# Patient Record
Sex: Male | Born: 1995 | Race: White | Hispanic: No | Marital: Single | Smoking: Never smoker
Health system: Southern US, Community
[De-identification: ages and names within clinical notes are randomized; demographics above are authoritative.]

## PROBLEM LIST (undated history)

## (undated) DIAGNOSIS — R42 Dizziness and giddiness: Secondary | ICD-10-CM

## (undated) HISTORY — DX: Dizziness and giddiness: R42

## (undated) HISTORY — PX: NO PAST SURGERIES: SHX2092

---
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
## 2017-03-05 DIAGNOSIS — K219 Gastro-esophageal reflux disease without esophagitis: Secondary | ICD-10-CM | POA: Diagnosis not present

## 2017-03-05 DIAGNOSIS — Z130 Encounter for screening for diseases of the blood and blood-forming organs and certain disorders involving the immune mechanism: Secondary | ICD-10-CM | POA: Diagnosis not present

## 2017-03-05 DIAGNOSIS — F419 Anxiety disorder, unspecified: Secondary | ICD-10-CM | POA: Diagnosis not present

## 2017-03-24 DIAGNOSIS — S67195A Crushing injury of left ring finger, initial encounter: Secondary | ICD-10-CM | POA: Diagnosis not present

## 2017-03-24 DIAGNOSIS — S62635B Displaced fracture of distal phalanx of left ring finger, initial encounter for open fracture: Secondary | ICD-10-CM | POA: Diagnosis not present

## 2017-03-24 DIAGNOSIS — S6992XA Unspecified injury of left wrist, hand and finger(s), initial encounter: Secondary | ICD-10-CM | POA: Diagnosis not present

## 2017-03-31 DIAGNOSIS — S62635D Displaced fracture of distal phalanx of left ring finger, subsequent encounter for fracture with routine healing: Secondary | ICD-10-CM | POA: Diagnosis not present

## 2017-04-15 ENCOUNTER — Other Ambulatory Visit: Payer: Self-pay | Admitting: Internal Medicine

## 2017-04-15 DIAGNOSIS — H538 Other visual disturbances: Secondary | ICD-10-CM | POA: Diagnosis not present

## 2017-04-15 DIAGNOSIS — Z1389 Encounter for screening for other disorder: Secondary | ICD-10-CM | POA: Diagnosis not present

## 2017-04-15 DIAGNOSIS — H539 Unspecified visual disturbance: Secondary | ICD-10-CM

## 2017-04-15 DIAGNOSIS — R519 Headache, unspecified: Secondary | ICD-10-CM

## 2017-04-15 DIAGNOSIS — R51 Headache: Principal | ICD-10-CM

## 2017-04-15 DIAGNOSIS — Z23 Encounter for immunization: Secondary | ICD-10-CM | POA: Diagnosis not present

## 2017-04-15 DIAGNOSIS — R42 Dizziness and giddiness: Secondary | ICD-10-CM

## 2017-04-16 ENCOUNTER — Other Ambulatory Visit (HOSPITAL_COMMUNITY): Payer: Self-pay | Admitting: Internal Medicine

## 2017-04-16 ENCOUNTER — Ambulatory Visit
Admission: RE | Admit: 2017-04-16 | Discharge: 2017-04-16 | Disposition: A | Discharge status: Home / Self Care | Payer: BLUE CROSS/BLUE SHIELD | Source: Ambulatory Visit | Attending: Internal Medicine | Admitting: Internal Medicine

## 2017-04-16 DIAGNOSIS — R42 Dizziness and giddiness: Secondary | ICD-10-CM

## 2017-04-16 DIAGNOSIS — R51 Headache: Secondary | ICD-10-CM | POA: Diagnosis not present

## 2017-04-16 DIAGNOSIS — R519 Headache, unspecified: Secondary | ICD-10-CM

## 2017-04-16 DIAGNOSIS — H539 Unspecified visual disturbance: Secondary | ICD-10-CM

## 2017-04-17 ENCOUNTER — Ambulatory Visit (HOSPITAL_COMMUNITY)
Admission: RE | Admit: 2017-04-17 | Discharge: 2017-04-17 | Disposition: A | Discharge status: Home / Self Care | Payer: BLUE CROSS/BLUE SHIELD | Source: Ambulatory Visit | Attending: Vascular Surgery | Admitting: Vascular Surgery

## 2017-04-17 DIAGNOSIS — R42 Dizziness and giddiness: Secondary | ICD-10-CM | POA: Insufficient documentation

## 2017-04-17 DIAGNOSIS — H539 Unspecified visual disturbance: Secondary | ICD-10-CM | POA: Insufficient documentation

## 2017-04-17 LAB — VAS US CAROTID
LCCADSYS: -114 cm/s
LCCAPDIAS: 37 cm/s
LEFT ECA DIAS: -22 cm/s
LICADSYS: -82 cm/s
LICAPSYS: -138 cm/s
Left CCA dist dias: -27 cm/s
Left CCA prox sys: 162 cm/s
Left ICA dist dias: -38 cm/s
Left ICA prox dias: -32 cm/s
RCCAPDIAS: 38 cm/s
RIGHT CCA MID DIAS: 33 cm/s
RIGHT ECA DIAS: -22 cm/s
Right CCA prox sys: 166 cm/s
Right cca dist sys: -93 cm/s

## 2017-04-21 DIAGNOSIS — S62635D Displaced fracture of distal phalanx of left ring finger, subsequent encounter for fracture with routine healing: Secondary | ICD-10-CM | POA: Diagnosis not present

## 2017-08-12 DIAGNOSIS — M5136 Other intervertebral disc degeneration, lumbar region: Secondary | ICD-10-CM | POA: Diagnosis not present

## 2017-08-12 DIAGNOSIS — M5431 Sciatica, right side: Secondary | ICD-10-CM | POA: Diagnosis not present

## 2017-08-12 DIAGNOSIS — M545 Low back pain: Secondary | ICD-10-CM | POA: Diagnosis not present

## 2017-08-12 DIAGNOSIS — M5432 Sciatica, left side: Secondary | ICD-10-CM | POA: Diagnosis not present

## 2017-08-20 DIAGNOSIS — M545 Low back pain: Secondary | ICD-10-CM | POA: Diagnosis not present

## 2017-08-27 DIAGNOSIS — M545 Low back pain: Secondary | ICD-10-CM | POA: Diagnosis not present

## 2017-08-27 DIAGNOSIS — M5386 Other specified dorsopathies, lumbar region: Secondary | ICD-10-CM | POA: Diagnosis not present

## 2017-08-27 DIAGNOSIS — M4316 Spondylolisthesis, lumbar region: Secondary | ICD-10-CM | POA: Diagnosis not present

## 2017-08-28 DIAGNOSIS — M5020 Other cervical disc displacement, unspecified cervical region: Secondary | ICD-10-CM | POA: Insufficient documentation

## 2017-08-28 DIAGNOSIS — M4310 Spondylolisthesis, site unspecified: Secondary | ICD-10-CM | POA: Insufficient documentation

## 2017-09-24 DIAGNOSIS — M545 Low back pain: Secondary | ICD-10-CM | POA: Diagnosis not present

## 2017-10-06 DIAGNOSIS — M5416 Radiculopathy, lumbar region: Secondary | ICD-10-CM | POA: Diagnosis not present

## 2017-10-09 DIAGNOSIS — M545 Low back pain: Secondary | ICD-10-CM | POA: Diagnosis not present

## 2017-10-13 DIAGNOSIS — M545 Low back pain: Secondary | ICD-10-CM | POA: Diagnosis not present

## 2017-10-16 DIAGNOSIS — M545 Low back pain: Secondary | ICD-10-CM | POA: Diagnosis not present

## 2017-10-20 DIAGNOSIS — M545 Low back pain: Secondary | ICD-10-CM | POA: Diagnosis not present

## 2017-10-23 DIAGNOSIS — M5136 Other intervertebral disc degeneration, lumbar region: Secondary | ICD-10-CM | POA: Diagnosis not present

## 2017-10-23 DIAGNOSIS — M545 Low back pain: Secondary | ICD-10-CM | POA: Diagnosis not present

## 2017-10-23 DIAGNOSIS — M4310 Spondylolisthesis, site unspecified: Secondary | ICD-10-CM | POA: Diagnosis not present

## 2017-10-23 DIAGNOSIS — M5386 Other specified dorsopathies, lumbar region: Secondary | ICD-10-CM | POA: Diagnosis not present

## 2017-10-27 DIAGNOSIS — M545 Low back pain: Secondary | ICD-10-CM | POA: Diagnosis not present

## 2017-10-30 DIAGNOSIS — M4310 Spondylolisthesis, site unspecified: Secondary | ICD-10-CM | POA: Diagnosis not present

## 2017-11-03 DIAGNOSIS — M545 Low back pain: Secondary | ICD-10-CM | POA: Diagnosis not present

## 2017-11-10 DIAGNOSIS — M545 Low back pain: Secondary | ICD-10-CM | POA: Diagnosis not present

## 2017-11-18 DIAGNOSIS — M4310 Spondylolisthesis, site unspecified: Secondary | ICD-10-CM | POA: Diagnosis not present

## 2018-01-14 DIAGNOSIS — M25561 Pain in right knee: Secondary | ICD-10-CM | POA: Diagnosis not present

## 2018-04-11 IMAGING — CT CT ABD-PELV W/ CM
2 of 4 series · 15 of 46 positions shown, 17 images · IV contrast (ISOVUE)
Comparison: None.

CLINICAL DATA: Right-sided abdominal pain.

EXAM:
CT ABDOMEN AND PELVIS WITH CONTRAST
TECHNIQUE: Multidetector CT imaging of the abdomen and pelvis was performed
using the standard protocol following bolus administration of
intravenous contrast.
CONTRAST:  100mL GKGZSC-IKK IOPAMIDOL (GKGZSC-IKK) INJECTION 61%

[Series 2: abd/pel with · axial · 0.69mm/px · z∈[-636,-236]mm · 12 of 91 slices shown, 14 images]
[im 6/91  soft-tissue]
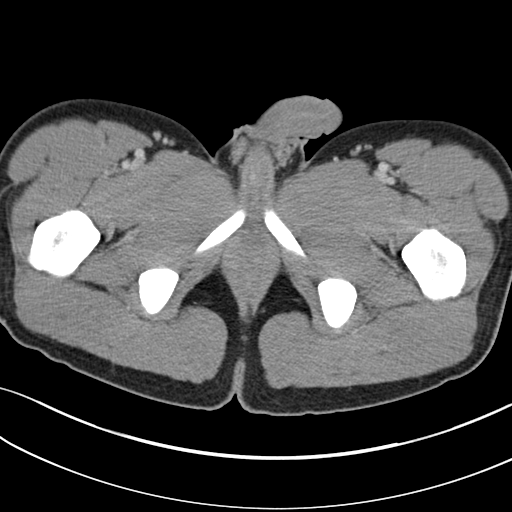
[im 6/91  bone]
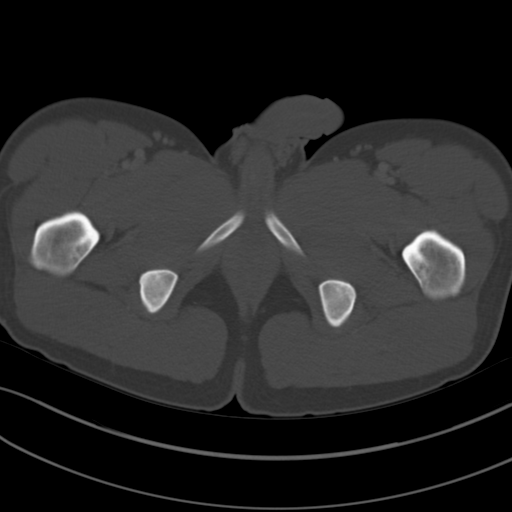
[im 16/91  soft-tissue]
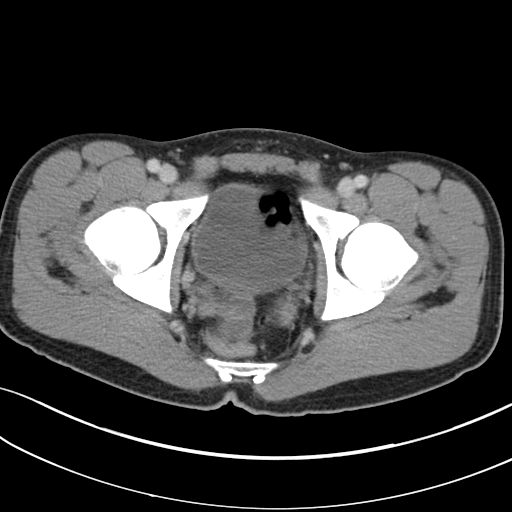
[im 21/91  soft-tissue]
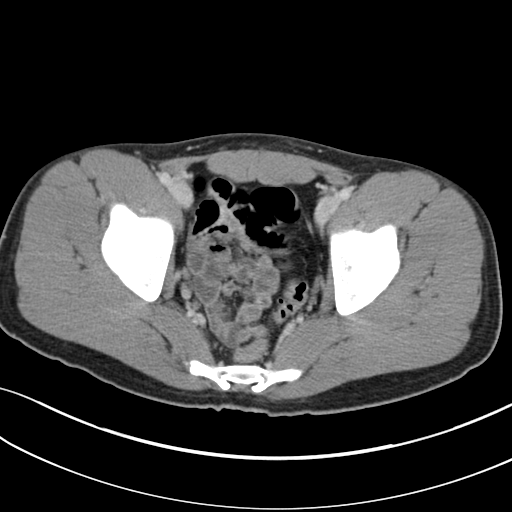
[im 26/91  soft-tissue]
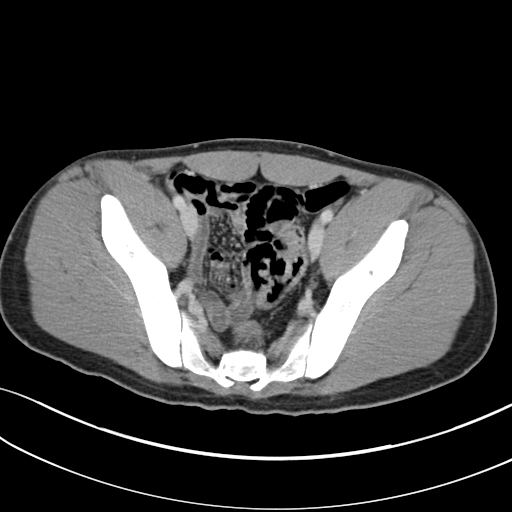
[im 36/91  soft-tissue]
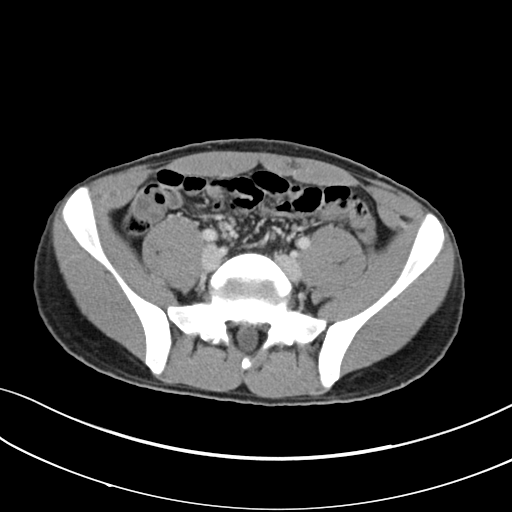
[im 41/91  soft-tissue]
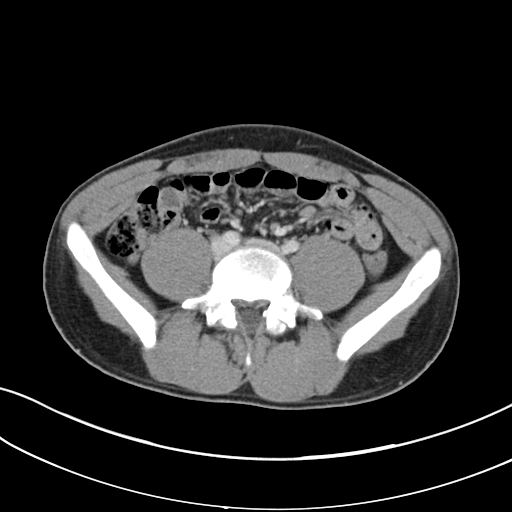
[im 51/91  soft-tissue]
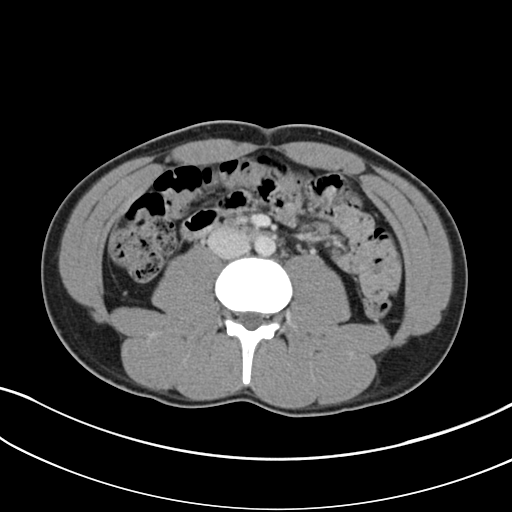
[im 56/91  soft-tissue]
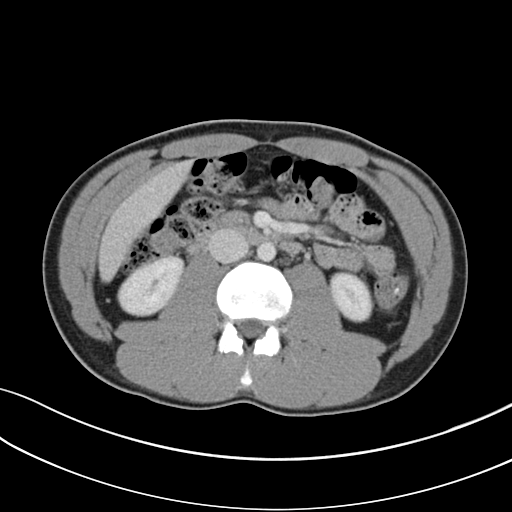
[im 66/91  soft-tissue]
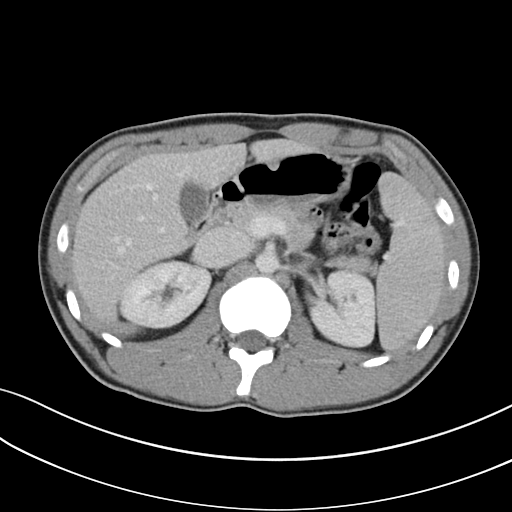
[im 66/91  bone]
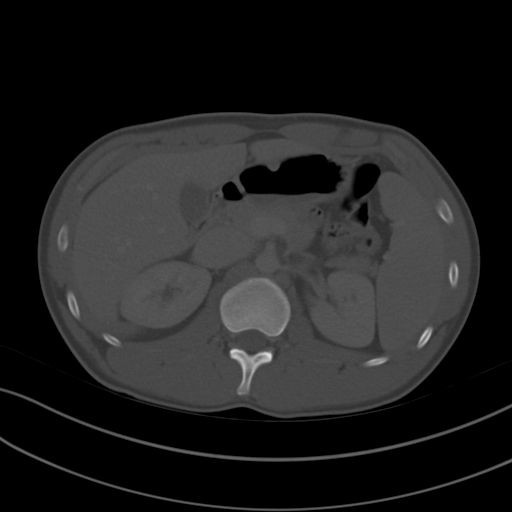
[im 71/91  soft-tissue]
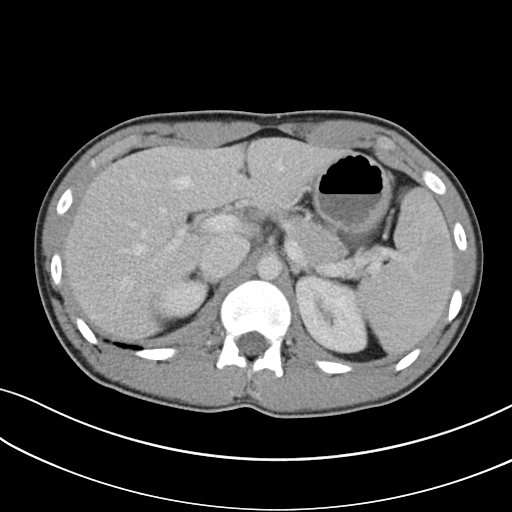
[im 76/91  soft-tissue]
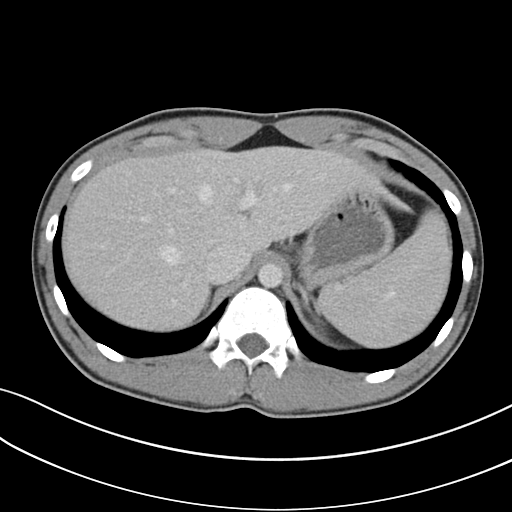
[im 86/91  soft-tissue]
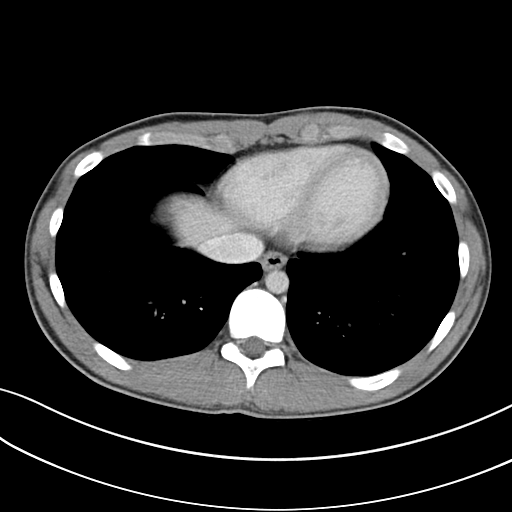

[Series 5: coronal a/|p · coronal · 0.61mm/px · 3 of 104 slices shown]
[im 35/104  soft-tissue]
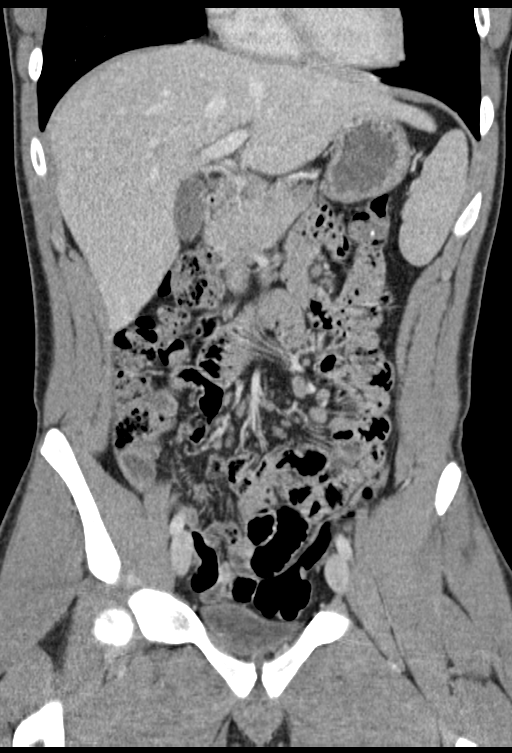
[im 46/104  soft-tissue]
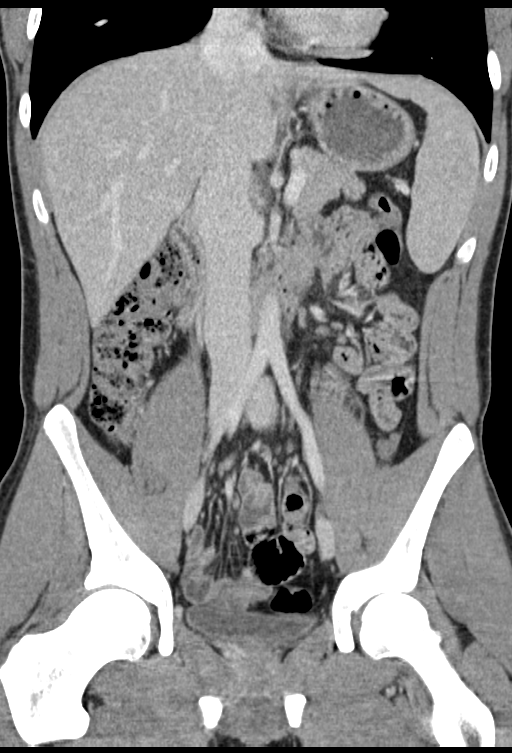
[im 58/104  soft-tissue]
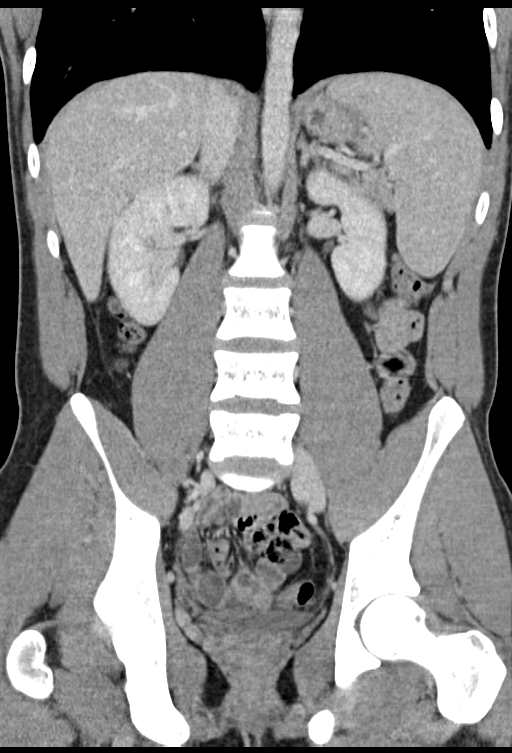

[15 of 46 positions shown; findings below may reference images not displayed]

FINDINGS: Lower chest: The lung bases are clear.

Hepatobiliary: Trace focal fatty infiltration adjacent with
falciform ligament. No focal lesion. Gallbladder physiologically
distended, no calcified stone. No biliary dilatation.

Pancreas: No ductal dilatation or inflammation.

Spleen: Upper normal in size measuring 13 cm.  No focal abnormality.

Adrenals/Urinary Tract: Adrenal glands are unremarkable. Kidneys are
normal, without renal calculi, focal lesion, or hydronephrosis.
Bladder is decompressed, grossly normal.

Stomach/Bowel: There is no periappendiceal inflammation. The
appendix is upper normal in caliber measuring 6 mm, no enhancement
or secondary signs of appendicitis. Stomach is physiologically
distended. No small bowel dilatation or obstruction. Small volume of
colonic stool. No colonic inflammation.

Vascular/Lymphatic: Prominent central mesenteric nodes measuring up
to 8 mm. No retroperitoneal or inguinal adenopathy. Normal aorta is
normal in caliber. No acute vascular findings.

Reproductive: Normal sized prostate gland.

Other: No free air, free fluid, or intra-abdominal fluid collection.

Musculoskeletal: Bilateral L5 pars interarticularis defects with
grade and anterolisthesis of L5 on S1. There are no acute or
suspicious osseous abnormalities.
IMPRESSION: 1. Increased mesenteric nodes suggesting mesenteric adenitis. No
evidence of appendicitis.
2. Incidental bilateral L5 pars interarticularis defects with grade
1 anterolisthesis of L5 on S1.

## 2018-04-22 DIAGNOSIS — S60221A Contusion of right hand, initial encounter: Secondary | ICD-10-CM | POA: Diagnosis not present

## 2019-12-14 ENCOUNTER — Ambulatory Visit: Payer: Self-pay | Attending: Internal Medicine

## 2019-12-14 DIAGNOSIS — Z23 Encounter for immunization: Secondary | ICD-10-CM

## 2020-11-27 ENCOUNTER — Emergency Department (HOSPITAL_BASED_OUTPATIENT_CLINIC_OR_DEPARTMENT_OTHER)
Admission: EM | Admit: 2020-11-27 | Discharge: 2020-11-28 | Disposition: A | Discharge status: Home / Self Care | Payer: Managed Care, Other (non HMO) | Attending: Emergency Medicine | Admitting: Emergency Medicine

## 2020-11-27 ENCOUNTER — Encounter (HOSPITAL_BASED_OUTPATIENT_CLINIC_OR_DEPARTMENT_OTHER): Payer: Self-pay

## 2020-11-27 ENCOUNTER — Other Ambulatory Visit: Payer: Self-pay

## 2020-11-27 DIAGNOSIS — S6991XA Unspecified injury of right wrist, hand and finger(s), initial encounter: Secondary | ICD-10-CM | POA: Diagnosis present

## 2020-11-27 DIAGNOSIS — S61212A Laceration without foreign body of right middle finger without damage to nail, initial encounter: Secondary | ICD-10-CM

## 2020-11-27 DIAGNOSIS — W25XXXA Contact with sharp glass, initial encounter: Secondary | ICD-10-CM | POA: Insufficient documentation

## 2020-11-27 DIAGNOSIS — S61211A Laceration without foreign body of left index finger without damage to nail, initial encounter: Secondary | ICD-10-CM | POA: Diagnosis not present

## 2020-11-27 MED ORDER — LIDOCAINE HCL 2 % IJ SOLN
5.0000 mL | Freq: Once | INTRAMUSCULAR | Status: AC
  Administered 2020-11-27: 100 mg
  Filled 2020-11-27: qty 20

## 2020-11-27 NOTE — ED Triage Notes (Signed)
Pt states he cut both hands ~20 min PTA on broken glass-NAD-steady gait

## 2020-11-27 NOTE — ED Notes (Signed)
Cut middle and index finger of left hand on glass that broke.  Bleeding controlled both lacerations small and well approximated.

## 2020-11-28 NOTE — Discharge Instructions (Addendum)
Have the stitches taken out in about a week. 

## 2020-11-28 NOTE — ED Provider Notes (Signed)
[facility] EMERGENCY DEPARTMENT Provider Note   CSN: [id number] Arrival date & time: 11/27/20  2230     History Chief Complaint  Patient presents with  . Laceration    Collin Gutierrez is a 25 y.o. male.  HPI Patient presents with finger lacerations.  A glass fell and he caught it and it broke.  Laceration to index finger on left hand and middle finger on right hand.  Tetanus is up-to-date.  States a piece of skin is missing from the left finger.  No numbness.  Still able to move fingers.    History reviewed. No pertinent past medical history.  There are no problems to display for this patient.   History reviewed. No pertinent surgical history.     Family History  Problem Relation Age of Onset  . Hypertension Father     Social History   Tobacco Use  . Smoking status: Never Smoker  . Smokeless tobacco: Never Used  Vaping Use  . Vaping Use: Never used  Substance Use Topics  . Alcohol use: Yes    Comment: occ  . Drug use: No    Home Medications Prior to Admission medications   Medication Sig Start Date End Date Taking? Authorizing Provider  docusate sodium (COLACE) 100 MG capsule Take 1 capsule (100 mg total) by mouth every 12 (twelve) hours. Patient not taking: Reported on 11/27/2016 12/14/13   Roxy Horseman, PA-C  famotidine (PEPCID) 10 MG tablet Take 10 mg by mouth daily as needed for heartburn or indigestion.    [provider]  Pediatric Multiple Vit-C-FA (MULTIVITAMIN ANIMAL SHAPES, WITH CA/FA,) with C & FA chewable tablet Chew 1 tablet by mouth daily.    [provider]    Allergies    Patient has no known allergies.  Review of Systems   Review of Systems  Constitutional: Negative for fever.  Musculoskeletal:       Finger lacerations.  Skin: Positive for wound.  Neurological: Negative for weakness and numbness.    Physical Exam Updated Vital Signs BP (!) 127/102 (BP Location: Right Arm)   Pulse 83   Temp  98.1 F (36.7 C) (Oral)   Resp 16   Ht 5\' 10"  (1.778 m)   Wt 86.2 kg   SpO2 98%   BMI 27.26 kg/m   Physical Exam Vitals and nursing note reviewed.  Constitutional:      Appearance: Normal appearance.  Musculoskeletal:     Comments: Approximately 7 mm laceration along palmar aspect of right third finger near the PIP joint.  Well approximated.  Able to flex and extend.  No tendon laceration seen.  Also on left index finger medially over distal phalanx there is a area of missing skin approximately 5 mm x 3 mm.  Neurological:     Mental Status: He is alert and oriented to person, place, and time.     ED Results / Procedures / Treatments   Labs (all labs ordered are listed, but only abnormal results are displayed) Labs Reviewed - No data to display  EKG None  Radiology No results found.  Procedures . Laceration Repair  Date/Time: 11/28/2020 12:15 AM Performed by: 01/28/2021, MD Authorized by: Benjiman Core, MD   Consent:    Consent obtained:  Verbal   Consent given by:  Patient   Risks discussed:  Infection, pain, retained foreign body, tendon damage, poor cosmetic result, poor wound healing, nerve damage and need for additional repair   Alternatives discussed:  No treatment,  delayed treatment and observation Anesthesia:    Anesthesia method:  Local infiltration   Local anesthetic:  Lidocaine 1% w/o epi Laceration details:    Location:  Finger   Finger location:  R long finger   Length (cm):  0.7 Pre-procedure details:    Preparation:  Patient was prepped and draped in usual sterile fashion Exploration:    Limited defect created (wound extended): no     Hemostasis achieved with:  Direct pressure   Wound exploration: wound explored through full range of motion and entire depth of wound visualized     Contaminated: no   Treatment:    Amount of cleaning:  Standard   Debridement:  None Skin repair:    Repair method:  Sutures   Suture size:  4-0   Wound  skin closure material used: vicryl.   Suture technique:  Simple interrupted   Number of sutures:  2 Approximation:    Approximation:  Close Repair type:    Repair type:  Simple Post-procedure details:    Dressing:  Sterile dressing   Procedure completion:  Tolerated well, no immediate complications     Medications Ordered in ED Medications  lidocaine (XYLOCAINE) 2 % (with pres) injection 100 mg (100 mg Infiltration Given 11/27/20 2330)    ED Course  I have reviewed the triage vital signs and the nursing notes.  Pertinent labs & imaging results that were available during my care of the patient were reviewed by me and considered in my medical decision making (see chart for details).    MDM Rules/Calculators/A&P                          Patient laceration to fingers.  No tendon involvement seen.  Wound on right middle finger closed.  Missing tissue on left index finger unable to close due to missing tissue.  Sutures placed.  Will dress wound and attempt to keep finger more straight.  Sutures out in about a week Final Clinical Impression(s) / ED Diagnoses Final diagnoses:  Laceration of right middle finger without foreign body without damage to nail, initial encounter    Rx / DC Orders ED Discharge Orders    None       Benjiman Core, MD 11/28/20 0500

## 2021-09-11 DIAGNOSIS — S43439A Superior glenoid labrum lesion of unspecified shoulder, initial encounter: Secondary | ICD-10-CM | POA: Insufficient documentation

## 2022-08-12 DIAGNOSIS — M25511 Pain in right shoulder: Secondary | ICD-10-CM | POA: Diagnosis not present

## 2022-08-26 DIAGNOSIS — M25511 Pain in right shoulder: Secondary | ICD-10-CM | POA: Diagnosis not present

## 2022-09-04 DIAGNOSIS — M25511 Pain in right shoulder: Secondary | ICD-10-CM | POA: Diagnosis not present

## 2022-10-23 DIAGNOSIS — J019 Acute sinusitis, unspecified: Secondary | ICD-10-CM | POA: Diagnosis not present

## 2023-01-03 DIAGNOSIS — R109 Unspecified abdominal pain: Secondary | ICD-10-CM | POA: Diagnosis not present

## 2023-01-03 DIAGNOSIS — R110 Nausea: Secondary | ICD-10-CM | POA: Diagnosis not present

## 2023-01-03 DIAGNOSIS — R1084 Generalized abdominal pain: Secondary | ICD-10-CM | POA: Diagnosis not present

## 2023-01-31 DIAGNOSIS — R42 Dizziness and giddiness: Secondary | ICD-10-CM | POA: Diagnosis not present

## 2023-01-31 DIAGNOSIS — H6993 Unspecified Eustachian tube disorder, bilateral: Secondary | ICD-10-CM | POA: Diagnosis not present

## 2023-08-14 DIAGNOSIS — R42 Dizziness and giddiness: Secondary | ICD-10-CM | POA: Diagnosis not present

## 2023-08-14 DIAGNOSIS — B349 Viral infection, unspecified: Secondary | ICD-10-CM | POA: Diagnosis not present

## 2023-08-25 DIAGNOSIS — R42 Dizziness and giddiness: Secondary | ICD-10-CM | POA: Diagnosis not present

## 2023-08-25 DIAGNOSIS — Z13220 Encounter for screening for lipoid disorders: Secondary | ICD-10-CM | POA: Diagnosis not present

## 2023-08-25 DIAGNOSIS — Z0000 Encounter for general adult medical examination without abnormal findings: Secondary | ICD-10-CM | POA: Diagnosis not present

## 2023-09-24 DIAGNOSIS — H8112 Benign paroxysmal vertigo, left ear: Secondary | ICD-10-CM | POA: Diagnosis not present

## 2023-12-29 DIAGNOSIS — R110 Nausea: Secondary | ICD-10-CM | POA: Diagnosis not present

## 2023-12-29 DIAGNOSIS — R42 Dizziness and giddiness: Secondary | ICD-10-CM | POA: Diagnosis not present

## 2023-12-29 DIAGNOSIS — R55 Syncope and collapse: Secondary | ICD-10-CM | POA: Diagnosis not present

## 2023-12-29 DIAGNOSIS — H539 Unspecified visual disturbance: Secondary | ICD-10-CM | POA: Diagnosis not present

## 2023-12-29 DIAGNOSIS — R5383 Other fatigue: Secondary | ICD-10-CM | POA: Diagnosis not present

## 2024-01-26 DIAGNOSIS — R42 Dizziness and giddiness: Secondary | ICD-10-CM | POA: Diagnosis not present

## 2024-02-09 DIAGNOSIS — R42 Dizziness and giddiness: Secondary | ICD-10-CM | POA: Diagnosis not present

## 2024-02-15 ENCOUNTER — Encounter: Payer: Self-pay | Admitting: Neurology

## 2024-02-15 ENCOUNTER — Ambulatory Visit (INDEPENDENT_AMBULATORY_CARE_PROVIDER_SITE_OTHER): Payer: Self-pay | Admitting: Neurology

## 2024-02-15 ENCOUNTER — Telehealth: Payer: Self-pay | Admitting: Neurology

## 2024-02-15 VITALS — BP 90/62 | HR 90 | Ht 70.0 in | Wt 192.0 lb

## 2024-02-15 DIAGNOSIS — R42 Dizziness and giddiness: Secondary | ICD-10-CM | POA: Diagnosis not present

## 2024-02-15 DIAGNOSIS — R5383 Other fatigue: Secondary | ICD-10-CM | POA: Diagnosis not present

## 2024-02-15 DIAGNOSIS — H8309 Labyrinthitis, unspecified ear: Secondary | ICD-10-CM | POA: Diagnosis not present

## 2024-02-15 MED ORDER — METHYLPREDNISOLONE 4 MG PO TBPK: ORAL_TABLET | ORAL | 1 refills | Status: DC

## 2024-02-15 NOTE — Progress Notes (Signed)
 [facility]    Provider:  Dr Ines Requesting Provider: Verta Izetta HERO, NP Primary Care Provider:  [name], MD  CC:  Dysequilibrium  HPI:  Collin Gutierrez is a 28 y.o. male here as requested by Verta Izetta HERO, NP for vertigo. Around every change of season he gets vertigo. This past season change he has been feeling dysequilibrium all the time, he has a hard time focusing, he has states of confusion sometimes for a second (no LOC), he has had it for over a month now. His nose runs like crazy, no prior illnesses, he had his first episode of vertigo last summer about hte same time around July he went to the beach and he had a lot of sea water, he hasn;t had an eye exam in years. When he leans his head back he feels like he is moving like he is drunk, no nausea, no vomiting, no falls, no loss of consciousness, no significant headaches. He has been feeling tired. Feeling tired. He has been staying up late, he lays in bed for a while and harder to fall asleep. It spurts out in random episodes. No numbness, no changes in sensation, weakness. No hearing changes. No ear pain. He has felt his ears are stopped up. Staying about the same the last month, not worsening. He tried zyrtec which did not help, the nasal flonase and meclizine. Episodes in the past have lasted 2-3 weeks and resolved after the flonase. This one has not. No difficultis walking or balance no new speech problems. He had some concussions in [place], 2-3 ear infections, concussions not recent and not related. No alcohol or marijuana use. When he gets out of bed it is worse when he feels imbalanced. He hydrates, no chest pain, no SOB or diaphoresis.    Reviewed notes, labs and imaging from outside physicians, which showed:  Reviewed MRI of the brain images(called and had [facility] upload to [place]), normal. 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   CBC 12/24/2023: reviewed and normal  Review of Systems: Patient complains of symptoms per HPI as well as the following symptoms none. Pertinent negatives and positives per HPI. All others negative.   Social History   Socioeconomic History   Marital status: Single    Spouse name: Not on file   Number of children: Not on file   Years of education: Not on file   Highest education level: Not on file  Occupational History   Not on file  Tobacco Use   Smoking status: Never   Smokeless tobacco: Never  Vaping Use   Vaping status: Never Used  Substance and Sexual Activity   Alcohol use: Yes    Alcohol/week: 5.0 standard drinks of alcohol    Types: 5 Cans of beer per week    Comment: occ   Drug use: No   Sexual activity: Not on file  Other Topics Concern   Not on file  Social History Narrative   Pt lives alone    Pt works    Social Drivers of Corporate investment banker Strain: Not on file  Food Insecurity: Not on file  Transportation Needs: Not on file  Physical Activity: Not on file  Stress: Not on file  Social Connections: Not on file  Intimate Partner Violence: Not on file    Family  History  Problem Relation Age of Onset   Hypertension Father    Migraines Neg Hx     Past Medical History:  Diagnosis Date   Vertigo     Patient Active Problem List   Diagnosis Date Noted   Vertigo 02/15/2024    Past Surgical History:  Procedure Laterality Date   NO PAST SURGERIES      Current Outpatient Medications  Medication Sig Dispense Refill   methylPREDNISolone  (MEDROL  DOSEPAK) 4 MG TBPK tablet Take pills daily all together with food. Take the first dose (6 pills) as soon as possible. Take the rest each morning. For 6 days total 6-5-4-3-2-1. 21 tablet 1   No current facility-administered medications for this visit.    Allergies as of  02/15/2024   (No Known Allergies)    Vitals: BP 90/62   Pulse 90   Ht 5' 10 (1.778 m)   Wt 192 lb (87.1 kg)   SpO2 99%   BMI 27.55 kg/m  Last Weight:  Wt Readings from Last 1 Encounters:  02/15/24 192 lb (87.1 kg)   Last Height:   Ht Readings from Last 1 Encounters:  02/15/24 5' 10 (1.778 m)     Physical exam: Exam: Gen: NAD, conversant, well nourised, well groomed                     CV: RRR, no MRG. No Carotid Bruits. No peripheral edema, warm, nontender Eyes: Conjunctivae clear without exudates or hemorrhage  Neuro: Detailed Neurologic Exam  Speech:    Speech is normal; fluent and spontaneous with normal comprehension.  Cognition:    The patient is oriented to person, place, and time;     recent and remote memory intact;     language fluent;     normal attention, concentration,     fund of knowledge Cranial Nerves:    The pupils are equal, round, and reactive to light. The fundi are normal and spontaneous venous pulsations are present. Visual fields are full to finger confrontation. Extraocular movements are intact. Trigeminal sensation is intact and the muscles of mastication are normal. The face is symmetric. The palate elevates in the midline. Hearing intact. Voice is normal. Shoulder shrug is normal. The tongue has normal motion without fasciculations.   Coordination:    Normal finger to nose and heel to shin. Normal rapid alternating movements.   Gait:    Heel-toe and tandem gait are normal.   Motor Observation:    No asymmetry, no atrophy, and no involuntary movements noted. Tone:    Normal muscle tone.    Posture:    Posture is normal. normal erect    Strength:    Strength is V/V in the upper and lower limbs.      Sensation: intact to LT     Reflex Exam:  DTR's:    Deep tendon reflexes in the upper and lower extremities are normal bilaterally.   Toes:    The toes are downgoing bilaterally.   Clonus:    Clonus is absent.     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 MRI brain normal. Neuro exam with some saccades and end-gave nystagmus but dix-hallpike not confirmational.  Eye exam: for difficulty with focusing, hasn't had eyes checks in years Vestibular Therapy : ordered Continue Sinus regimen Allergy Doctor : he has been called, encouraged calling back for appointment ENT evaluation : referred  Ongoing for a month may be inner ear inflammation can try 6-day steroid medrol  dosepak see if it helps For fatigue and generally not feeling well will also check cmp and tsh  Reviewed MRI of the brain images(called and had [facility] upload to [place]), normal. I could not visualize the sub-centimeter non-specific rounded enhancing lesion in the left parietal calvarium without aggressive features, favored benign, even had my radiologist take a look, no series/image specified in report reviewed. Unfortunately bone lesions are not usually in the neurologic realm even if in the skull, from a quick search causes can include metastasis, sarcoid, developmental bone disease, Various other tumors, both benign and malignant, which can also affect the calvarium and exhibit enhancement, such as osteomas, osteosarcomas, and certain types of lymphomas, Langerhans Cell Histiocytosis, infections;  recommend pcp follow up with repeat imaging possibly 4-6 months(sooner if worsening pain in the area, he states he feels it when he touched the parietal bone) and then follow and/or workup for the above as clinically warranted by pcp.     Orders Placed This Encounter  Procedures   Comprehensive metabolic panel with GFR   TSH Rfx on Abnormal to Free T4   Ambulatory referral to Physical Therapy   Ambulatory referral to ENT   Meds ordered this encounter  Medications   methylPREDNISolone  (MEDROL  DOSEPAK) 4 MG TBPK tablet    Sig: Take pills daily all  together with food. Take the first dose (6 pills) as soon as possible. Take the rest each morning. For 6 days total 6-5-4-3-2-1.    Dispense:  21 tablet    Refill:  1    Cc: Hyatt, Izetta HERO, NP,  [name], MD  Onetha Epp, MD  [facility] [address] [place], [place] [address]  Phone [phone] Fax [phone]  I spent 65 minutes of face-to-face and non-face-to-face time with patient on the  1. Vertigo   2. Dysequilibrium   3. Labyrinthitis, unspecified laterality   4. Other fatigue    diagnosis.  This included previsit chart review, lab review, study review, order entry, electronic health record documentation, patient education on the different diagnostic and therapeutic options, counseling and coordination of care, risks and benefits of management, compliance, or risk factor reduction

## 2024-02-15 NOTE — Telephone Encounter (Signed)
 Referral for ENT fax to [facility]. Phone: [phone], Fax: [phone]

## 2024-02-15 NOTE — Patient Instructions (Addendum)
 Eye exam Vestibular Therapy  Continue Sinus regimen Allergy Doctor  ENT evaluation   Vertigo Vertigo is the feeling that you or the things around you are moving or spinning when they're not. It's different than feeling dizzy. It can also cause: Loss of balance. Trouble standing or walking. Nausea and vomiting. This feeling can come and go at any time. It can last from a few seconds to minutes or even hours. It may go away on its own or be treated with medicine. What are the types of vertigo? There are two types of vertigo: Peripheral vertigo happens when parts of your inner ear don't work like they should. This is the more common type. Central vertigo happens when your brain and spinal cord don't work like they should. Your health care provider will do tests to find out what kind of vertigo you have. This will help them decide on the right treatment for you. Follow these instructions at home: Eating and drinking Drink enough fluid to keep your pee (urine) pale yellow. Do not drink alcohol. Activity When you get up in the morning, first sit up on the side of the bed. When you feel okay, stand slowly while holding onto something. Move slowly. Avoid sudden body or head movements. Avoid certain positions, as told by your provider. Use a cane if you have trouble standing or walking. Sit down right away if you feel unsteady. Place items in your home so they're easy for you to reach without bending or leaning over. Return to normal activities when you're told. Ask what things are safe for you to do. General instructions Take your medicines only as told by your provider. Contact a health care provider if: Your medicines don't help or make your vertigo worse. You get new symptoms. You have a fever. You have nausea or vomiting. Your family or friends spot any changes in how you're acting. A part of your body goes numb. You feel tingling and prickling in a part of your body. You get very  bad headaches. Get help right away if: You're always dizzy or you faint. You have a stiff neck. You have trouble moving or speaking. Your hands, arms, or legs feel weak. Your hearing or eyesight changes. These symptoms may be an emergency. Call 911 right away. Do not wait to see if the symptoms will go away. Do not drive yourself to the hospital. This information is not intended to replace advice given to you by your health care provider. Make sure you discuss any questions you have with your health care provider. Document Revised: 04/09/2023 Document Reviewed: 10/10/2022 Elsevier Patient Education  2024 [employer].

## 2024-02-16 ENCOUNTER — Ambulatory Visit: Payer: Self-pay | Admitting: Neurology

## 2024-02-16 LAB — COMPREHENSIVE METABOLIC PANEL WITH GFR
ALT: 34 IU/L (ref 0–44)
AST: 21 IU/L (ref 0–40)
Albumin: 4.9 g/dL (ref 4.3–5.2)
Alkaline Phosphatase: 91 IU/L (ref 44–121)
BUN/Creatinine Ratio: 14 (ref 9–20)
BUN: 14 mg/dL (ref 6–20)
Bilirubin Total: 0.5 mg/dL (ref 0.0–1.2)
CO2: 22 mmol/L (ref 20–29)
Calcium: 9.8 mg/dL (ref 8.7–10.2)
Chloride: 102 mmol/L (ref 96–106)
Creatinine, Ser: 0.99 mg/dL (ref 0.76–1.27)
Globulin, Total: 3.4 g/dL (ref 1.5–4.5)
Glucose: 88 mg/dL (ref 70–99)
Potassium: 4 mmol/L (ref 3.5–5.2)
Sodium: 140 mmol/L (ref 134–144)
Total Protein: 8.3 g/dL (ref 6.0–8.5)
eGFR: 108 mL/min/1.73 (ref >59)

## 2024-02-16 LAB — TSH RFX ON ABNORMAL TO FREE T4: TSH: 3.87 u[IU]/mL (ref 0.450–4.500)

## 2024-02-17 NOTE — Telephone Encounter (Signed)
I called pt and relayed normal results.  He verbalized understanding.

## 2024-02-17 NOTE — Telephone Encounter (Signed)
-----   Message from Onetha KATHEE Epp sent at 02/16/2024 12:29 PM EDT ----- Tally normal, thanks ----- Message ----- From: Rebecka Memos Lab Results In Sent: 02/16/2024   5:36 AM EDT To: Onetha KATHEE Epp, MD

## 2024-02-23 ENCOUNTER — Ambulatory Visit: Payer: Self-pay | Attending: Neurology | Admitting: Physical Therapy

## 2024-02-23 DIAGNOSIS — R2681 Unsteadiness on feet: Secondary | ICD-10-CM | POA: Insufficient documentation

## 2024-02-23 DIAGNOSIS — R42 Dizziness and giddiness: Secondary | ICD-10-CM | POA: Insufficient documentation

## 2024-02-23 NOTE — Therapy (Deleted)
 OUTPATIENT PHYSICAL THERAPY VESTIBULAR EVALUATION     Patient Name: Collin Gutierrez MRN: [id number] DOB:10-13-1995, 28 y.o., male Today's Date: 02/23/2024  END OF SESSION:   Past Medical History:  Diagnosis Date   Vertigo    Past Surgical History:  Procedure Laterality Date   NO PAST SURGERIES     Patient Active Problem List   Diagnosis Date Noted   Vertigo 02/15/2024    PCP:  Collin Hamilton, MD   REFERRING PROVIDER: Ines Onetha KATHEE, MD  REFERRING DIAG: R42 (ICD-10-CM) - Vertigo R42 (ICD-10-CM) - Dysequilibrium  THERAPY DIAG:  No diagnosis found.  ONSET DATE: 02/15/2024  Rationale for Evaluation and Treatment: {HABREHAB:27488}  SUBJECTIVE:   SUBJECTIVE STATEMENT: *** Pt accompanied by: {accompnied:27141}  PERTINENT HISTORY:    Per Dr. Ines:  Collin Gutierrez is a 28 y.o. male here as requested by Collin Izetta HERO, NP for vertigo. Around every change of season he gets vertigo. This past season change he has been feeling dysequilibrium all the time, he has a hard time focusing, he has states of confusion sometimes for a second (no LOC), he has had it for over a month now. His nose runs like crazy, no prior illnesses, he had his first episode of vertigo last summer about hte same time around July he went to the beach and he had a lot of sea water, he hasn;t had an eye exam in years. When he leans his head back he feels like he is moving like he is drunk, no nausea, no vomiting, no falls, no loss of consciousness, no significant headaches.   Reviewed MRI of the brain images(called and had [facility] upload to [facility]), normal. I could not visualize the sub-centimeter non-specific rounded enhancing lesion in the left parietal calvarium without aggressive features, favored benign even had my radiologist take a look, no series/image specified in exam.   PAIN:  Are you having pain? {OPRCPAIN:27236}  PRECAUTIONS: {Therapy precautions:24002}  RED FLAGS: {PT Red  Flags:29287}   WEIGHT BEARING RESTRICTIONS: {Yes ***/No:24003}  FALLS: Has patient fallen in last 6 months? {fallsyesno:27318}  LIVING ENVIRONMENT: Lives with: {OPRC lives with:25569::lives with their family} Lives in: {Lives in:25570} Stairs: {opstairs:27293} Has following equipment at home: {Assistive devices:23999}  PLOF: {PLOF:24004}  PATIENT GOALS: ***  OBJECTIVE:  Note: Objective measures were completed at Evaluation unless otherwise noted.  DIAGNOSTIC FINDINGS: ***  COGNITION: Overall cognitive status: {cognition:24006}   SENSATION: {sensation:27233}  EDEMA:  {edema:24020}  MUSCLE TONE:  {LE tone:25568}  DTRs:  {DTR SITE:24025}  POSTURE:  {posture:25561}  Cervical ROM:    {AROM/PROM:27142} A/PROM (deg) eval  Flexion   Extension   Right lateral flexion   Left lateral flexion   Right rotation   Left rotation   (Blank rows = not tested)  STRENGTH: ***  LOWER EXTREMITY MMT:   MMT Right eval Left eval  Hip flexion    Hip abduction    Hip adduction    Hip internal rotation    Hip external rotation    Knee flexion    Knee extension    Ankle dorsiflexion    Ankle plantarflexion    Ankle inversion    Ankle eversion    (Blank rows = not tested)  BED MOBILITY:  {Bed mobility:24027}  TRANSFERS: Assistive device utilized: {Assistive devices:23999}  Sit to stand: {Levels of assistance:24026} Stand to sit: {Levels of assistance:24026} Chair to chair: {Levels of assistance:24026} Floor: {Levels of assistance:24026}  RAMP: {Levels of assistance:24026}  CURB: {Levels of assistance:24026}  GAIT: Gait  pattern: {gait characteristics:25376} Distance walked: *** Assistive device utilized: {Assistive devices:23999} Level of assistance: {Levels of assistance:24026} Comments: ***  FUNCTIONAL TESTS:  {Functional tests:24029}  PATIENT SURVEYS:  {rehab surveys:24030}  VESTIBULAR ASSESSMENT:  GENERAL OBSERVATION: ***   SYMPTOM  BEHAVIOR:  Subjective history: ***  Non-Vestibular symptoms: {nonvestibular symptoms:25260}  Type of dizziness: {Type of Dizziness:25255}  Frequency: ***  Duration: ***  Aggravating factors: {Aggravating Factors:25258}  Relieving factors: {Relieving Factors:25259}  Progression of symptoms: {DESC; BETTER/WORSE:18575}  OCULOMOTOR EXAM:  Ocular Alignment: {Ocular Alignment:25262}  Ocular ROM: {RANGE OF MOTION:21649}  Spontaneous Nystagmus: {Spontaneous nystagmus:25263}  Gaze-Induced Nystagmus: {gaze-induced nystagmus:25264}  Smooth Pursuits: {smooth pursuit:25265}  Saccades: {saccades:25266}  Convergence/Divergence: *** cm   FRENZEL - FIXATION SUPRESSED:  Ocular Alignment: {Ocular Alignment:25262}  Spontaneous Nystagmus: {Spontaneous nystagmus:25263}  Gaze-Induced Nystagmus: {gaze-induced nystagmus:25264}  Horizontal head shaking - induced nystagmus: {head shaking induced nystagmus:25267}  Vertical head shaking - induced nystagmus: {head shaking induced nystagmus:25267}  Positional tests: {Positional tests:25271}  Pressure tests: {frenzel pressure tests:25268}  VESTIBULAR - OCULAR REFLEX:   Slow VOR: {slow VOR:25290}  VOR Cancellation: {vor cancellation:25291}  Head-Impulse Test: {head impulse test:25272}  Dynamic Visual Acuity: {dynamic visual acuity:25273}   POSITIONAL TESTING: {Positional tests:25271}  MOTION SENSITIVITY:  Motion Sensitivity Quotient Intensity: 0 = none, 1 = Lightheaded, 2 = Mild, 3 = Moderate, 4 = Severe, 5 = Vomiting  Intensity  1. Sitting to supine   2. Supine to L side   3. Supine to R side   4. Supine to sitting   5. L Hallpike-Dix   6. Up from L    7. R Hallpike-Dix   8. Up from R    9. Sitting, head tipped to L knee   10. Head up from L knee   11. Sitting, head tipped to R knee   12. Head up from R knee   13. Sitting head turns x5   14.Sitting head nods x5   15. In stance, 180 turn to L    16. In stance, 180 turn to R     OTHOSTATICS:  {Exam; orthostatics:31331}  FUNCTIONAL GAIT: {Functional tests:24029}                                                                                                                             TREATMENT DATE: ***   Canalith Repositioning:  {Canalith Repositioning:25283} Gaze Adaptation:  {gaze adaptation:25286} Habituation:  {habituation:25288} Other: ***  PATIENT EDUCATION: Education details: *** Person educated: {Person educated:25204} Education method: {Education Method:25205} Education comprehension: {Education Comprehension:25206}  HOME EXERCISE PROGRAM:  GOALS: Goals reviewed with patient? {yes/no:20286}  SHORT TERM GOALS: Target date: ***  *** Baseline: Goal status: {GOALSTATUS:25110}  2.  *** Baseline:  Goal status: {GOALSTATUS:25110}  3.  *** Baseline:  Goal status: {GOALSTATUS:25110}  4.  *** Baseline:  Goal status: {GOALSTATUS:25110}  5.  *** Baseline:  Goal status: {GOALSTATUS:25110}  6.  *** Baseline:  Goal status: {GOALSTATUS:25110}  LONG TERM GOALS: Target date: ***  ***  Baseline:  Goal status: {GOALSTATUS:25110}  2.  *** Baseline:  Goal status: {GOALSTATUS:25110}  3.  *** Baseline:  Goal status: {GOALSTATUS:25110}  4.  *** Baseline:  Goal status: {GOALSTATUS:25110}  5.  *** Baseline:  Goal status: {GOALSTATUS:25110}  6.  *** Baseline:  Goal status: {GOALSTATUS:25110}  ASSESSMENT:  CLINICAL IMPRESSION: Patient is a *** y.o. *** who was seen today for physical therapy evaluation and treatment for ***.   OBJECTIVE IMPAIRMENTS: {opptimpairments:25111}.   ACTIVITY LIMITATIONS: {activitylimitations:27494}  PARTICIPATION LIMITATIONS: {participationrestrictions:25113}  PERSONAL FACTORS: {Personal factors:25162} are also affecting patient's functional outcome.   REHAB POTENTIAL: {rehabpotential:25112}  CLINICAL DECISION MAKING: {clinical decision making:25114}  EVALUATION COMPLEXITY: {Evaluation  complexity:25115}   PLAN:  PT FREQUENCY: {rehab frequency:25116}  PT DURATION: {rehab duration:25117}  PLANNED INTERVENTIONS: {rehab planned interventions:25118::97110-Therapeutic exercises,97530- Therapeutic [phone]- Neuromuscular re-education,97535- Self Rjmz,02859- Manual therapy}  PLAN FOR NEXT SESSION: ***   [place] LOISE Senate, PT, DPT 02/23/2024, 7:56 AM

## 2024-02-29 ENCOUNTER — Ambulatory Visit: Payer: Self-pay

## 2024-02-29 DIAGNOSIS — R2681 Unsteadiness on feet: Secondary | ICD-10-CM

## 2024-02-29 DIAGNOSIS — R42 Dizziness and giddiness: Secondary | ICD-10-CM | POA: Diagnosis not present

## 2024-02-29 NOTE — Therapy (Signed)
 OUTPATIENT PHYSICAL THERAPY VESTIBULAR EVALUATION     Patient Name: Collin Gutierrez MRN: [id number] DOB:November 02, 1995, 28 y.o., male Today's Date: 02/29/2024  END OF SESSION:  PT End of Session - 02/29/24 0841     Visit Number 1    Number of Visits 13    Date for PT Re-Evaluation 04/29/24   to allow for delay in scheduling   Authorization Type [employer]    PT Start Time 0845    PT Stop Time 0930    PT Time Calculation (min) 45 min    Activity Tolerance Patient tolerated treatment well    Behavior During Therapy [facility] for tasks assessed/performed          Past Medical History:  Diagnosis Date   Vertigo    Past Surgical History:  Procedure Laterality Date   NO PAST SURGERIES     Patient Active Problem List   Diagnosis Date Noted   Vertigo 02/15/2024    PCP: Glendia Freeman, MD REFERRING PROVIDER: Onetha Epp, MD  REFERRING DIAG:  R42 (ICD-10-CM) - Vertigo  R42 (ICD-10-CM) - Dysequilibrium    THERAPY DIAG:  Unsteadiness on feet - Plan: PT plan of care cert/re-cert  Dizziness and giddiness - Plan: PT plan of care cert/re-cert  ONSET DATE: 02/15/24 referral   Rationale for Evaluation and Treatment: Rehabilitation  SUBJECTIVE:   SUBJECTIVE STATEMENT: Patient arrives to clinic alone, no AD. He reports feeling terrible- vertigo has been on and off for the past few months. Started again a few days ago. Patient reports feeling like he's drunk right now. Did play basketball ~3 weeks ago and got shaken up. Does report difficulty focusing in terms of vision changes.  Pt accompanied by: self  PERTINENT HISTORY: 2x concussions ~7 years ago  PAIN:  Are you having pain? Yes: NPRS scale: 2-3/10 Pain location: HA Pain description: dull  PRECAUTIONS: Fall  WEIGHT BEARING RESTRICTIONS: No  FALLS: Has patient fallen in last 6 months? 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  DIAGNOSTIC FINDINGS: 04/16/17 head CT IMPRESSION: Normal head CT.  COGNITION: Overall cognitive status: Within functional limits for tasks assessed   SENSATION: WFL   Cervical ROM:   WFL, pain free  STRENGTH: WFL  BED MOBILITY:  Intermittent dizziness with bed mobility   GAIT: Gait pattern: WFL   PATIENT SURVEYS:  DHI: THE DIZZINESS HANDICAP INVENTORY (DHI)  P1. Does looking up increase your problem? 4 = Yes  E2. Because of your problem, do you feel frustrated? 4 = Yes  F3. Because of your problem, do you restrict your travel for business or recreation?  4 = Yes  P4. Does walking down the aisle of a supermarket increase your problems?  2 = Sometimes  F5. Because of your problem, do you have difficulty getting into or out of bed?  2 = Sometimes  F6. Does your problem significantly restrict your participation in social activities, such as going out to dinner, going to the movies, dancing, or going to parties? 4 = Yes  F7. Because of your problem, do you have difficulty reading?  2 = Sometimes  P8. Does performing more ambitious  activities such as sports, dancing, household chores (sweeping or putting dishes away) increase your problems?  4 = Yes  E9. Because of your problem, are you afraid to leave your home without having without having someone accompany you?  4 = Yes  E10. Because of your problem have you been embarrassed in front of others?  2 = Sometimes  P11. Do quick movements of your head increase your problem?  4 = Yes  F12. Because of your problem, do you avoid heights?  2 = Sometimes  P13. Does turning over in bed increase your problem?  2 = Sometimes  F14.  Because of your problem, is it difficult for you to do strenuous homework or yard work? 4 = Yes  E15. Because of your problem, are you afraid people may think you are intoxicated? 2 = Sometimes  F16. Because of your problem, is it difficult for you to go for a walk by yourself?  2 = Sometimes  P17. Does walking down a sidewalk increase your problem?  0 = No  E18.Because of your problem, is it difficult for you to concentrate 4 = Yes  F19. Because of your problem, is it difficult for you to walk around your house in the dark? 2 = Sometimes  E20. Because of your problem, are you afraid to stay home alone?  2 = Sometimes  E21. Because of your problem, do you feel handicapped? 4 = Yes  E22. Has the problem placed stress on your relationships with members of your family or friends? 2 = Sometimes  E23. Because of your problem, are you depressed?  4 = Yes  F24. Does your problem interfere with your job or household responsibilities?  4 = Yes  P25. Does bending over increase your problem?  2 = Sometimes  TOTAL 60/100    DHI Scoring Instructions  The patient is asked to answer each question as it pertains to dizziness or unsteadiness problems, specifically  considering their condition during the last month. Questions are designed to incorporate functional (F), physical  (P), and emotional (E) impacts on disability.   Scores greater than 10 points should be referred to balance specialists for further evaluation.   16-34 Points (mild handicap)  36-52 Points (moderate handicap)  54+ Points (severe handicap)  Minimally Detectable Change: 17 points ([address] [place], 1990)  [place], G. SHAUNNA. and Reubens, C. W. (1990). The development of the Dizziness Handicap Inventory. Archives of Otolaryngology - Head and Neck Surgery 116(4): [id number].   VM- PATHI: 55  VESTIBULAR ASSESSMENT:  GENERAL OBSERVATION: NAD, no AD   SYMPTOM BEHAVIOR:  Subjective history: see above  Non-Vestibular symptoms: changes  in hearing, changes in vision, headaches, tinnitus, and nausea/vomiting  Type of dizziness: Imbalance (Disequilibrium) and drunk  Frequency: daily  Duration: days  Aggravating factors: Spontaneous, Induced by position change: rolling to the right and rolling to the left, Induced by motion: looking up at the ceiling, bending down to the ground, driving, and activity in general, Worse with fatigue, Worse in the morning, Worse outside or in busy environment, Occurs when standing still , and Moving eyes  Relieving factors: medication and rest  Progression of symptoms: worse  OCULOMOTOR EXAM:  Ocular Alignment: normal wears glasses  Ocular ROM: No Limitations  Spontaneous Nystagmus: absent  Gaze-Induced Nystagmus: absent  Smooth Pursuits: intact 3/5  Saccades: intact1-2/5 horizontal   Convergence/Divergence: ~25 cm    VESTIBULAR - OCULAR REFLEX:   Slow VOR: Normal  VOR Cancellation: Unable to Maintain Gaze  Head-Impulse Test: HIT Right: negative HIT Left: positive  Dynamic Visual Acuity: TBA   POSITIONAL TESTING: Right Dix-Hallpike: no nystagmus and 2/5 dizziness Left Dix-Hallpike: no nystagmus Right Roll Test: no nystagmus Left Roll Test: no nystagmus  MOTION SENSITIVITY:  Motion Sensitivity Quotient Intensity: 0 = none, 1 = Lightheaded, 2 = Mild, 3 = Moderate, 4 = Severe, 5 = Vomiting  Intensity  1. Sitting to supine 0  2. Supine to L side 2  3. Supine to R side 2  4. Supine to sitting 0  5. L Hallpike-Dix 2  6. Up from L  0  7. R Hallpike-Dix 2  8. Up from R  0  9. Sitting, head tipped to L knee 2  10. Head up from L knee 2-3  11. Sitting, head tipped to R knee 0  12. Head up from R knee 2  13. Sitting head turns x5 2  14.Sitting head nods x5 2  15. In stance, 180 turn to L    16. In stance, 180 turn to R                                                                                                             TREATMENT Self care/home management: -ddx and  etiology of symptoms  - initial HEP   PATIENT EDUCATION: Education details: PT POC, exam findings, initial HEP  Person educated: Patient Education method: Explanation, Demonstration, and Handouts Education comprehension: verbalized understanding and needs further education  HOME EXERCISE PROGRAM: Access Code: [email or web address] URL: [email or web address] Date: 02/29/2024 Prepared by: Delon Pop  Exercises - Seated VOR Cancellation  - 3 x daily - 7 x weekly - 3 sets - 30s hold  Gaze Stabilization: Sitting    Keeping eyes on target on wall 5 feet away, tilt head down 15-30 and move head side to side for __30__ seconds. Repeat while moving head up and down for __30__ seconds. Do __3__ sessions per day. GOALS: Goals reviewed with patient? Yes  SHORT TERM GOALS: Target date: 04/01/24  Pt will be independent with initial HEP for improved symptom report   Baseline: to be updated Goal status: INITIAL  2.  Patient will demonstrate improved convergence to </= 15cm for improved binocular function Baseline: 25cm Goal status: INITIAL  3.  MCTSIB goal  Baseline: to be assessed Goal status: INITIAL  4. Patient will score </=50/100 on the DHI to demonstrate improvement in symptom report and reduced handicap related to dizziness  Baseline: 60/100  Goal status: INITIAL   5. Patient will score </= 38/100 on the VM- PATHI to demonstrate improvement in symptom report   Baseline: 55/100  Goal status: INITIAL  LONG TERM GOALS: Target date: 04/29/24  Pt will be independent with final HEP for improved symptom report   Baseline: to be updated Goal status: INITIAL  2.  Patient will improve convergence to </= 10cm to demonstrate improved binocular function Baseline: 25cm Goal status: INITIAL  3.  MCTSIB goal  Baseline: to be assessed Goal status: INITIAL  4.  Patient will  improve DHI score to </= 35/100 to demonstrate improved symptom report and reduction in handicap  related to dizziness Baseline: 60/100 Goal status: INITIAL  5.  Patient will improve VM-PATHI score to </= 17/100 to demonstrate reduction in symptom report  Baseline: 55/100 Goal status: INITIAL   ASSESSMENT:  CLINICAL IMPRESSION: Patient is a 28 y.o. male who was seen today for physical therapy evaluation and treatment for dizziness. He has a distant h/o multiple concussions. This most recent flare of dizziness began ~3 weeks ago. He states he was in a basketball game and got shaken up. Since then, he feels a sense of disequilibrium as though he were drunk. 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 His DHI score is reflective of severe handicap related to his symptoms. Given his description of motion sensitivity and the length of time for which it persists, vestibular migraines are at least a ddx. He scored a 55 on the VM-PATHI reflective of significant impact of his symptoms. He would benefit from skilled PT services to address the above mentioned deficits.    OBJECTIVE IMPAIRMENTS: dizziness.   ACTIVITY LIMITATIONS: bending, sitting, standing, bed mobility, and locomotion level  PARTICIPATION LIMITATIONS: interpersonal relationship, driving, shopping, community activity, occupation, yard work, and school  PERSONAL FACTORS: Age, Fitness, Past/current experiences, Profession, and Time since onset of injury/illness/exacerbation are also affecting patient's functional outcome.   REHAB POTENTIAL: Good  CLINICAL DECISION MAKING: Evolving/moderate complexity  EVALUATION COMPLEXITY: Moderate   PLAN:  PT FREQUENCY: 2x/week  PT DURATION: 6 weeks  PLANNED INTERVENTIONS: 97164- PT Re-evaluation, 97750- Physical Performance Testing, 97110-Therapeutic exercises, 97530- Therapeutic activity, 97112- Neuromuscular re-education, 97535- Self Care, 02859- Manual therapy, [phone]- Gait training, [phone]- Orthotic Initial, [phone]-  Orthotic/Prosthetic subsequent, [phone]- Canalith repositioning, [phone]- Aquatic Therapy, Patient/Family education, Balance training, Stair training, Vestibular training, Visual/preceptual remediation/compensation, Cognitive remediation, and DME instructions  PLAN FOR NEXT SESSION: MCTSIB + goal, VOR, VOR cancellation, NPC/brock string    Delon DELENA Pop, PT Delon DELENA Pop, PT, DPT, CBIS  02/29/2024, 11:07 AM

## 2024-03-04 DIAGNOSIS — H53002 Unspecified amblyopia, left eye: Secondary | ICD-10-CM | POA: Diagnosis not present

## 2024-03-04 DIAGNOSIS — H5201 Hypermetropia, right eye: Secondary | ICD-10-CM | POA: Diagnosis not present

## 2024-03-09 ENCOUNTER — Ambulatory Visit

## 2024-03-11 ENCOUNTER — Encounter: Payer: Self-pay | Admitting: Physical Therapy

## 2024-03-11 ENCOUNTER — Ambulatory Visit: Admitting: Physical Therapy

## 2024-03-11 DIAGNOSIS — R42 Dizziness and giddiness: Secondary | ICD-10-CM | POA: Diagnosis not present

## 2024-03-11 DIAGNOSIS — R2681 Unsteadiness on feet: Secondary | ICD-10-CM

## 2024-03-11 NOTE — Therapy (Addendum)
 OUTPATIENT PHYSICAL THERAPY VESTIBULAR TREATMENT     Patient Name: Collin Gutierrez MRN: [id number] DOB:Sep 04, 1995, 28 y.o., male Today's Date: 03/11/2024  END OF SESSION:  PT End of Session - 03/11/24 1233     Visit Number 2    Number of Visits 13    Date for PT Re-Evaluation 04/29/24   to allow for delay in scheduling   Authorization Type BCBS    PT Start Time 1232    PT Stop Time 1311    PT Time Calculation (min) 39 min    Activity Tolerance Patient tolerated treatment well   limited by dizziness   Behavior During Therapy [facility] for tasks assessed/performed          Past Medical History:  Diagnosis Date   Vertigo    Past Surgical History:  Procedure Laterality Date   NO PAST SURGERIES     Patient Active Problem List   Diagnosis Date Noted   Vertigo 02/15/2024    PCP: Glendia Freeman, MD REFERRING PROVIDER: Onetha Epp, MD  REFERRING DIAG:  R42 (ICD-10-CM) - Vertigo  R42 (ICD-10-CM) - Dysequilibrium    THERAPY DIAG:  Unsteadiness on feet  Dizziness and giddiness  ONSET DATE: 02/15/24 referral   Rationale for Evaluation and Treatment: Rehabilitation  SUBJECTIVE:   SUBJECTIVE STATEMENT: No changes, started going back to school, so hasn't had the chance to work on his exercises much.  When looking down at his computer screen and looking up then will get dizzy. Gets dizzy too when walking on campus and going uphill.   Pt accompanied by: self  PERTINENT HISTORY: 2x concussions ~7 years ago  PAIN:  Are you having pain? No  PRECAUTIONS: Fall  WEIGHT BEARING RESTRICTIONS: No  FALLS: Has patient fallen in last 6 months? 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  DIAGNOSTIC FINDINGS: 04/16/17 head CT IMPRESSION: Normal head CT.  COGNITION: Overall cognitive status: Within functional limits for tasks assessed   SENSATION: WFL   Cervical ROM:   WFL, pain free  STRENGTH: WFL  BED MOBILITY:  Intermittent dizziness with bed mobility   GAIT: Gait pattern: WFL   PATIENT SURVEYS:  DHI: THE DIZZINESS HANDICAP INVENTORY (DHI)  P1. Does looking up increase your problem? 4 = Yes  E2. Because of your problem, do you feel frustrated? 4 = Yes  F3. Because of your problem, do you restrict your travel for business or recreation?  4 = Yes  P4. Does walking down the aisle of a supermarket increase your problems?  2 = Sometimes  F5. Because of your problem, do you have difficulty getting into or out of bed?  2 = Sometimes  F6. Does your problem significantly restrict your participation in social activities, such as going out to dinner, going to the movies, dancing, or going to parties? 4 = Yes  F7. Because of your problem, do you have difficulty reading?  2 = Sometimes  P8. Does performing more ambitious activities such as sports, dancing, household chores (sweeping or putting dishes away) increase your problems?  4 = Yes  E9. Because of your problem, are you afraid to leave  your home without having without having someone accompany you?  4 = Yes  E10. Because of your problem have you been embarrassed in front of others?  2 = Sometimes  P11. Do quick movements of your head increase your problem?  4 = Yes  F12. Because of your problem, do you avoid heights?  2 = Sometimes  P13. Does turning over in bed increase your problem?  2 = Sometimes  F14. Because of your problem, is it difficult for you to do strenuous homework or yard work? 4 = Yes  E15. Because of your problem, are you afraid people may think you are intoxicated? 2 = Sometimes  F16.  Because of your problem, is it difficult for you to go for a walk by yourself?  2 = Sometimes  P17. Does walking down a sidewalk increase your problem?  0 = No  E18.Because of your problem, is it difficult for you to concentrate 4 = Yes  F19. Because of your problem, is it difficult for you to walk around your house in the dark? 2 = Sometimes  E20. Because of your problem, are you afraid to stay home alone?  2 = Sometimes  E21. Because of your problem, do you feel handicapped? 4 = Yes  E22. Has the problem placed stress on your relationships with members of your family or friends? 2 = Sometimes  E23. Because of your problem, are you depressed?  4 = Yes  F24. Does your problem interfere with your job or household responsibilities?  4 = Yes  P25. Does bending over increase your problem?  2 = Sometimes  TOTAL 60/100    DHI Scoring Instructions  The patient is asked to answer each question as it pertains to dizziness or unsteadiness problems, specifically  considering their condition during the last month. Questions are designed to incorporate functional (F), physical  (P), and emotional (E) impacts on disability.   Scores greater than 10 points should be referred to balance specialists for further evaluation.   16-34 Points (mild handicap)  36-52 Points (moderate handicap)  54+ Points (severe handicap)  Minimally Detectable Change: 17 points ([address] [place], 1990)  [place], G. SHAUNNA. and [place], C. W. (1990). The development of the Dizziness Handicap Inventory. Archives of Otolaryngology - Head and Neck Surgery 116(4): [id number].   VM- PATHI: 55  VESTIBULAR ASSESSMENT:  GENERAL OBSERVATION: NAD, no AD   SYMPTOM BEHAVIOR:  Subjective history: see above  Non-Vestibular symptoms: changes in hearing, changes in vision, headaches, tinnitus, and nausea/vomiting  Type of dizziness: Imbalance (Disequilibrium) and drunk  Frequency: daily  Duration: days  Aggravating factors:  Spontaneous, Induced by position change: rolling to the right and rolling to the left, Induced by motion: looking up at the ceiling, bending down to the ground, driving, and activity in general, Worse with fatigue, Worse in the morning, Worse outside or in busy environment, Occurs when standing still , and Moving eyes  Relieving factors: medication and rest  Progression of symptoms: worse  OCULOMOTOR EXAM:  Ocular Alignment: normal wears glasses  Ocular ROM: No Limitations  Spontaneous Nystagmus: absent  Gaze-Induced Nystagmus: absent  Smooth Pursuits: intact 3/5  Saccades: intact1-2/5 horizontal   Convergence/Divergence: ~25 cm    VESTIBULAR - OCULAR REFLEX:   Slow VOR: Normal  VOR Cancellation: Unable to Maintain Gaze  Head-Impulse Test: HIT Right: negative HIT Left: positive  Dynamic Visual Acuity: TBA   POSITIONAL TESTING: Right Dix-Hallpike: no nystagmus and 2/5 dizziness Left Dix-Hallpike: no nystagmus Right  Roll Test: no nystagmus Left Roll Test: no nystagmus  MOTION SENSITIVITY:  Motion Sensitivity Quotient Intensity: 0 = none, 1 = Lightheaded, 2 = Mild, 3 = Moderate, 4 = Severe, 5 = Vomiting  Intensity  1. Sitting to supine 0  2. Supine to L side 2  3. Supine to R side 2  4. Supine to sitting 0  5. L Hallpike-Dix 2  6. Up from L  0  7. R Hallpike-Dix 2  8. Up from R  0  9. Sitting, head tipped to L knee 2  10. Head up from L knee 2-3  11. Sitting, head tipped to R knee 0  12. Head up from R knee 2  13. Sitting head turns x5 2  14.Sitting head nods x5 2  15. In stance, 180 turn to L    16. In stance, 180 turn to R                                                                                                             TREATMENT  NMR:    M-CTSIB  Condition 1: Firm Surface, EO 30 Sec, Normal Sway  Condition 2: Firm Surface, EC 30 Sec, Normal Sway  Condition 3: Foam Surface, EO 30 Sec, Normal Sway  Condition 4: Foam Surface, EC 30 Sec, Mild sway with  some dizziness    Gaze Adaptation: x1 Viewing Horizontal: Position: Seated, Time: 30 seconds, Reps: 1, and Comment: performing slowly, mild dizziness  x1 Viewing Vertical:  Position: Seated, Time: 30 seconds, Reps: 1, and Comment: moderate dizziness    Seated Hart Chart: Horizontal direction: 2 sets of 5 lines, dizziness and random/foggy confusion, had more difficulty going to the R when finding place in letter chart Vertical direction: performed 4 sets of 2 lines with moderate dizziness and reporting taking a lot of mental effort, needing brief rest break between each set   Lucious string: Focusing on first bead, 5 bouts of 15 seconds each, pt reporting dizziness and eye fatigue  On air ex:  Feet apart: EO 2 x 5 reps head turns with mild symptoms, 5 reps head nods (severe dizziness) Feet apart: EC 2 x 30 seconds , incr dizziness   PATIENT EDUCATION: Education details: additions to HEP for saccades and [employer], educated on purpose of exercises and purpose of vestibular therapy  Person educated: Patient Education method: Explanation, Demonstration, and Handouts Education comprehension: verbalized understanding and needs further education  HOME EXERCISE PROGRAM: Access Code: [email or web address] URL: [email or web address] Date: 03/11/2024 Prepared by: Sheffield Senate  Exercises - Seated VOR Cancellation  - 3 x daily - 7 x weekly - 3 sets - 30s hold - Brock String  - 3 x daily - 7 x weekly - 2 sets - 5 reps (just focusing on first bead)  Gaze Stabilization: Sitting    Keeping eyes on target on wall 5 feet away, tilt head down 15-30 and move head side to side for __30__ seconds. Repeat while moving head up and down for __30__ seconds. Do __3__ sessions  per day.  GOALS: Goals reviewed with patient? Yes  SHORT TERM GOALS: Target date: 04/01/24  Pt will be independent with initial HEP for improved symptom report   Baseline: to be updated Goal status: INITIAL  2.   Patient will demonstrate improved convergence to </= 15cm for improved binocular function Baseline: 25cm Goal status: INITIAL  3.  MCTSIB goal  Baseline: LTG written  Goal status: MET  4. Patient will score </=50/100 on the DHI to demonstrate improvement in symptom report and reduced handicap related to dizziness  Baseline: 60/100  Goal status: INITIAL   5. Patient will score </= 38/100 on the VM- PATHI to demonstrate improvement in symptom report   Baseline: 55/100  Goal status: INITIAL  LONG TERM GOALS: Target date: 04/29/24  Pt will be independent with final HEP for improved symptom report   Baseline: to be updated Goal status: INITIAL  2.  Patient will improve convergence to </= 10cm to demonstrate improved binocular function Baseline: 25cm Goal status: INITIAL  3.  Pt will be able to perform condition 4 of mCTSIB with no reports of dizziness.  Baseline: mild/mod dizziness  Goal status: INITIAL  4.  Patient will improve DHI score to </= 35/100 to demonstrate improved symptom report and reduction in handicap related to dizziness Baseline: 60/100 Goal status: INITIAL  5.  Patient will improve VM-PATHI score to </= 17/100 to demonstrate reduction in symptom report  Baseline: 55/100 Goal status: INITIAL   ASSESSMENT:  CLINICAL IMPRESSION: Today's skilled session focused on progressing vestibular and oculomotor exercises. Pt with most dizziness with tasks involving vertical head and eye movements. With seated Hart Chart in vertical direction, pt able to perform 2 lines at a time instead of all 5 before needing a rest due to dizziness. Pt able to hold all 4 conditions of mCTSIB for 30 seconds, but did have incr dizziness after condition 4. Added seated hart chart and brock string (just starting at initial bead) to HEP to address saccades/convergence. Will continue per POC.  OBJECTIVE IMPAIRMENTS: dizziness.   ACTIVITY LIMITATIONS: bending, sitting, standing, bed mobility,  and locomotion level  PARTICIPATION LIMITATIONS: interpersonal relationship, driving, shopping, community activity, occupation, yard work, and school  PERSONAL FACTORS: Age, Fitness, Past/current experiences, Profession, and Time since onset of injury/illness/exacerbation are also affecting patient's functional outcome.   REHAB POTENTIAL: Good  CLINICAL DECISION MAKING: Evolving/moderate complexity  EVALUATION COMPLEXITY: Moderate   PLAN:  PT FREQUENCY: 2x/week  PT DURATION: 6 weeks  PLANNED INTERVENTIONS: 97164- PT Re-evaluation, 97750- Physical Performance Testing, 97110-Therapeutic exercises, 97530- Therapeutic activity, [id number]- Neuromuscular re-education, 97535- Self Care, 02859- Manual therapy, [id number]- Gait training, [phone]- Orthotic Initial, [phone]- Orthotic/Prosthetic subsequent, [phone]- Canalith repositioning, [phone]- Aquatic Therapy, Patient/Family education, Balance training, Stair training, Vestibular training, Visual/preceptual remediation/compensation, Cognitive remediation, and DME instructions  PLAN FOR NEXT SESSION: progress VOR, VOR cancellation, NPC, brock string (pt only able to tolerate initially staring at first bead, progress as able), balance with EC, head motions as pt can tolerate   Sheffield Senate, PT, DPT 03/11/24 1:12 PM

## 2024-03-14 ENCOUNTER — Telehealth: Payer: Self-pay | Admitting: Physical Therapy

## 2024-03-14 ENCOUNTER — Encounter: Admitting: Physical Therapy

## 2024-03-14 NOTE — Telephone Encounter (Signed)
 Attempted to call pt regarding no show appt, but pt did not have identifiable voicemail, so unable to leave message.  Sheffield Senate, PT, DPT 03/14/24 10:46 AM    [facility] [address] [place], [place]  [address] Phone:  [phone] Fax:  [phone]

## 2024-03-15 NOTE — Progress Notes (Unsigned)
 New Patient Note  RE: Collin Gutierrez MRN: [id number] DOB: 02/07/1996 Date of Office Visit: 03/16/2024  Consult requested by: Celestia Krabbe, NP Primary care provider: [name], MD  Chief Complaint: No chief complaint on file.  History of Present Illness: I had the pleasure of seeing Collin Gutierrez for initial evaluation at the [facility] on 03/16/2024. He is a 28 y.o. male, who is referred here by Celestia Krabbe, NP for the evaluation of ***.  Discussed the use of AI scribe software for clinical note transcription with the patient, who gave verbal consent to proceed.  History of Present Illness             ***  Assessment and Plan: [name] is a 28 y.o. male with: ***  Assessment and Plan               No follow-ups on file.  No orders of the defined types were placed in this encounter.  Lab Orders  No laboratory test(s) ordered today    Other allergy screening: Asthma: {Blank single:19197::yes,no} Rhino conjunctivitis: {Blank single:19197::yes,no} Food allergy: {Blank single:19197::yes,no} Medication allergy: {Blank single:19197::yes,no} Hymenoptera allergy: {Blank single:19197::yes,no} Urticaria: {Blank single:19197::yes,no} Eczema:{Blank single:19197::yes,no} History of recurrent infections suggestive of immunodeficency: {Blank single:19197::yes,no}  Diagnostics: Spirometry:  Tracings reviewed. His effort: {Blank single:19197::Good reproducible efforts.,It was hard to get consistent efforts and there is a question as to whether this reflects a maximal maneuver.,Poor effort, data can not be interpreted.} FVC: ***L FEV1: ***L, ***% predicted FEV1/FVC ratio: ***% Interpretation: {Blank single:19197::Spirometry consistent with mild obstructive disease,Spirometry consistent with moderate obstructive disease,Spirometry consistent with severe obstructive  disease,Spirometry consistent with possible restrictive disease,Spirometry consistent with mixed obstructive and restrictive disease,Spirometry uninterpretable due to technique,Spirometry consistent with normal pattern,No overt abnormalities noted given today's efforts}.  Please see scanned spirometry results for details.  Skin Testing: {Blank single:19197::Select foods,Environmental allergy panel,Environmental allergy panel and select foods,Food allergy panel,None,Deferred due to recent antihistamines use}. *** Results discussed with patient/family.   Past Medical History: Patient Active Problem List   Diagnosis Date Noted  . Vertigo 02/15/2024   Past Medical History:  Diagnosis Date  . Vertigo    Past Surgical History: Past Surgical History:  Procedure Laterality Date  . NO PAST SURGERIES     Medication List:  Current Outpatient Medications  Medication Sig Dispense Refill  . methylPREDNISolone  (MEDROL  DOSEPAK) 4 MG TBPK tablet Take pills daily all together with food. Take the first dose (6 pills) as soon as possible. Take the rest each morning. For 6 days total 6-5-4-3-2-1. 21 tablet 1   No current facility-administered medications for this visit.   Allergies: No Known Allergies Social History: Social History   Socioeconomic History  . Marital status: Single    Spouse name: Not on file  . Number of children: Not on file  . Years of education: Not on file  . Highest education level: Not on file  Occupational History  . Not on file  Tobacco Use  . Smoking status: Never  . Smokeless tobacco: Never  Vaping Use  . Vaping status: Never Used  Substance and Sexual Activity  . Alcohol use: Yes    Alcohol/week: 5.0 standard drinks of alcohol    Types: 5 Cans of beer per week    Comment: occ  . Drug use: No  . Sexual activity: Not on file  Other Topics Concern  . Not on file  Social History Narrative   ** Merged History Encounter **  Pt  lives alone  Pt works    Social Drivers of Community education officer: Not on [employer] Insecurity: Not on file  Transportation Needs: Not on file  Physical Activity: Not on file  Stress: Not on file  Social Connections: Not on file   Lives in a ***. Smoking: *** Occupation: ***  Environmental HistorySurveyor, minerals in the house: Network engineer in the family room: {Blank single:19197::yes,no} Carpet in the bedroom: {Blank single:19197::yes,no} Heating: {Blank single:19197::electric,gas,heat pump} Cooling: {Blank single:19197::central,window,heat pump} Pet: {Blank single:19197::yes ***,no}  Family History: Family History  Problem Relation Age of Onset  . Hypertension Father   . Migraines Neg Hx    Problem                               Relation Asthma                                   *** Eczema                                *** Food allergy                          *** Allergic rhino conjunctivitis     ***  Review of Systems  Constitutional:  Negative for appetite change, chills, fever and unexpected weight change.  HENT:  Negative for congestion and rhinorrhea.   Eyes:  Negative for itching.  Respiratory:  Negative for cough, chest tightness, shortness of breath and wheezing.   Cardiovascular:  Negative for chest pain.  Gastrointestinal:  Negative for abdominal pain.  Genitourinary:  Negative for difficulty urinating.  Skin:  Negative for rash.  Neurological:  Negative for headaches.    Objective: There were no vitals taken for this visit. There is no height or weight on file to calculate BMI. Physical Exam Vitals and nursing note reviewed.  Constitutional:      Appearance: Normal appearance. He is well-developed.  HENT:     Head: Normocephalic and atraumatic.     Right Ear: Tympanic membrane and external ear normal.     Left Ear: Tympanic membrane and external ear normal.     Nose: Nose  normal.     Mouth/Throat:     Mouth: Mucous membranes are moist.     Pharynx: Oropharynx is clear.  Eyes:     Conjunctiva/sclera: Conjunctivae normal.  Cardiovascular:     Rate and Rhythm: Normal rate and regular rhythm.     Heart sounds: Normal heart sounds. No murmur heard.    No friction rub. No gallop.  Pulmonary:     Effort: Pulmonary effort is normal.     Breath sounds: Normal breath sounds. No wheezing, rhonchi or rales.  Musculoskeletal:     Cervical back: Neck supple.  Skin:    General: Skin is warm.     Findings: No rash.  Neurological:     Mental Status: He is alert and oriented to person, place, and time.  Psychiatric:        Behavior: Behavior normal.   The plan was reviewed with the patient/family, and all questions/concerned were addressed.  It was my pleasure to see Collin Gutierrez today and participate in his care. Please feel free to contact  me with any questions or concerns.  Sincerely,  Orlan Cramp, DO Allergy & Immunology  [facility]  [place] office: [phone] [facility] office: [phone]

## 2024-03-16 ENCOUNTER — Ambulatory Visit (INDEPENDENT_AMBULATORY_CARE_PROVIDER_SITE_OTHER): Admitting: Allergy

## 2024-03-16 ENCOUNTER — Ambulatory Visit

## 2024-03-16 ENCOUNTER — Encounter: Payer: Self-pay | Admitting: Allergy

## 2024-03-16 VITALS — BP 118/78 | HR 102 | Ht 70.0 in | Wt 190.0 lb

## 2024-03-16 DIAGNOSIS — J302 Other seasonal allergic rhinitis: Secondary | ICD-10-CM | POA: Insufficient documentation

## 2024-03-16 DIAGNOSIS — R42 Dizziness and giddiness: Secondary | ICD-10-CM

## 2024-03-16 DIAGNOSIS — R2681 Unsteadiness on feet: Secondary | ICD-10-CM

## 2024-03-16 DIAGNOSIS — T781XXA Other adverse food reactions, not elsewhere classified, initial encounter: Secondary | ICD-10-CM

## 2024-03-16 DIAGNOSIS — H8110 Benign paroxysmal vertigo, unspecified ear: Secondary | ICD-10-CM | POA: Insufficient documentation

## 2024-03-16 DIAGNOSIS — J3089 Other allergic rhinitis: Secondary | ICD-10-CM

## 2024-03-16 DIAGNOSIS — T781XXD Other adverse food reactions, not elsewhere classified, subsequent encounter: Secondary | ICD-10-CM | POA: Diagnosis not present

## 2024-03-16 DIAGNOSIS — I493 Ventricular premature depolarization: Secondary | ICD-10-CM | POA: Insufficient documentation

## 2024-03-16 NOTE — Therapy (Signed)
 OUTPATIENT PHYSICAL THERAPY VESTIBULAR TREATMENT     Patient Name: Collin Gutierrez MRN: [id number] DOB:04/17/1996, 28 y.o., male Today's Date: 03/16/2024  END OF SESSION:  PT End of Session - 03/16/24 1148     Visit Number 3    Number of Visits 13    Date for PT Re-Evaluation 04/29/24    Authorization Type BCBS    PT Start Time 1147    PT Stop Time 1227    PT Time Calculation (min) 40 min    Activity Tolerance Patient tolerated treatment well    Behavior During Therapy [facility] for tasks assessed/performed          Past Medical History:  Diagnosis Date   Vertigo    Past Surgical History:  Procedure Laterality Date   NO PAST SURGERIES     Patient Active Problem List   Diagnosis Date Noted   Vertigo 02/15/2024    PCP: Glendia Freeman, MD REFERRING PROVIDER: Onetha Epp, MD  REFERRING DIAG:  R42 (ICD-10-CM) - Vertigo  R42 (ICD-10-CM) - Dysequilibrium    THERAPY DIAG:  Unsteadiness on feet  Dizziness and giddiness  ONSET DATE: 02/15/24 referral   Rationale for Evaluation and Treatment: Rehabilitation  SUBJECTIVE:   SUBJECTIVE STATEMENT: Patient reports positive changes. Does have some issues with accommodation during class. No longer wearing glasses.   Pt accompanied by: self  PERTINENT HISTORY: 2x concussions ~7 years ago  PAIN:  Are you having pain? No  PRECAUTIONS: Fall  WEIGHT BEARING RESTRICTIONS: No  FALLS: Has patient fallen in last 6 months? 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  DIAGNOSTIC FINDINGS: 04/16/17 head CT IMPRESSION: Normal  head CT.  COGNITION: Overall cognitive status: Within functional limits for tasks assessed   SENSATION: WFL   Cervical ROM:   WFL, pain free  STRENGTH: WFL  BED MOBILITY:  Intermittent dizziness with bed mobility   GAIT: Gait pattern: WFL   PATIENT SURVEYS:  DHI: THE DIZZINESS HANDICAP INVENTORY (DHI)  P1. Does looking up increase your problem? 4 = Yes  E2. Because of your problem, do you feel frustrated? 4 = Yes  F3. Because of your problem, do you restrict your travel for business or recreation?  4 = Yes  P4. Does walking down the aisle of a supermarket increase your problems?  2 = Sometimes  F5. Because of your problem, do you have difficulty getting into or out of bed?  2 = Sometimes  F6. Does your problem significantly restrict your participation in social activities, such as going out to dinner, going to the movies, dancing, or going to parties? 4 = Yes  F7. Because of your problem, do you have difficulty reading?  2 = Sometimes  P8. Does performing more ambitious activities such as sports, dancing, household chores (sweeping or putting dishes away) increase your problems?  4 = Yes  E9. Because of your problem, are you afraid to leave your home without having without having someone accompany you?  4 = Yes  E10. Because of your problem have you been embarrassed in front of others?  2 = Sometimes  P11. Do quick movements of your  head increase your problem?  4 = Yes  F12. Because of your problem, do you avoid heights?  2 = Sometimes  P13. Does turning over in bed increase your problem?  2 = Sometimes  F14. Because of your problem, is it difficult for you to do strenuous homework or yard work? 4 = Yes  E15. Because of your problem, are you afraid people may think you are intoxicated? 2 = Sometimes  F16. Because of your problem, is it difficult for you to go for a walk by yourself?  2 = Sometimes  P17. Does walking down a sidewalk increase your problem?  0 = No  E18.Because  of your problem, is it difficult for you to concentrate 4 = Yes  F19. Because of your problem, is it difficult for you to walk around your house in the dark? 2 = Sometimes  E20. Because of your problem, are you afraid to stay home alone?  2 = Sometimes  E21. Because of your problem, do you feel handicapped? 4 = Yes  E22. Has the problem placed stress on your relationships with members of your family or friends? 2 = Sometimes  E23. Because of your problem, are you depressed?  4 = Yes  F24. Does your problem interfere with your job or household responsibilities?  4 = Yes  P25. Does bending over increase your problem?  2 = Sometimes  TOTAL 60/100    DHI Scoring Instructions  The patient is asked to answer each question as it pertains to dizziness or unsteadiness problems, specifically  considering their condition during the last month. Questions are designed to incorporate functional (F), physical  (P), and emotional (E) impacts on disability.   Scores greater than 10 points should be referred to balance specialists for further evaluation.   16-34 Points (mild handicap)  36-52 Points (moderate handicap)  54+ Points (severe handicap)  Minimally Detectable Change: 17 points ([address] [place], 1990)  [place], G. SHAUNNA. and [place], C. W. (1990). The development of the Dizziness Handicap Inventory. Archives of Otolaryngology - Head and Neck Surgery 116(4): [id number].   VM- PATHI: 55  VESTIBULAR ASSESSMENT:  GENERAL OBSERVATION: NAD, no AD   SYMPTOM BEHAVIOR:  Subjective history: see above  Non-Vestibular symptoms: changes in hearing, changes in vision, headaches, tinnitus, and nausea/vomiting  Type of dizziness: Imbalance (Disequilibrium) and drunk  Frequency: daily  Duration: days  Aggravating factors: Spontaneous, Induced by position change: rolling to the right and rolling to the left, Induced by motion: looking up at the ceiling, bending down to the ground, driving, and activity in  general, Worse with fatigue, Worse in the morning, Worse outside or in busy environment, Occurs when standing still , and Moving eyes  Relieving factors: medication and rest  Progression of symptoms: worse  OCULOMOTOR EXAM:  Ocular Alignment: normal wears glasses  Ocular ROM: No Limitations  Spontaneous Nystagmus: absent  Gaze-Induced Nystagmus: absent  Smooth Pursuits: intact 3/5  Saccades: intact1-2/5 horizontal   Convergence/Divergence: ~25 cm    VESTIBULAR - OCULAR REFLEX:   Slow VOR: Normal  VOR Cancellation: Unable to Maintain Gaze  Head-Impulse Test: HIT Right: negative HIT Left: positive  Dynamic Visual Acuity: TBA   POSITIONAL TESTING: Right Dix-Hallpike: no nystagmus and 2/5 dizziness Left Dix-Hallpike: no nystagmus Right Roll Test: no nystagmus Left Roll Test: no nystagmus  MOTION SENSITIVITY:  Motion Sensitivity Quotient Intensity: 0 = none, 1 = Lightheaded, 2 = Mild, 3 = Moderate, 4 = Severe, 5 = Vomiting  Intensity  1. Sitting to supine 0  2. Supine to L side 2  3. Supine to R side 2  4. Supine to sitting 0  5. L Hallpike-Dix 2  6. Up from L  0  7. R Hallpike-Dix 2  8. Up from R  0  9. Sitting, head tipped to L knee 2  10. Head up from L knee 2-3  11. Sitting, head tipped to R knee 0  12. Head up from R knee 2  13. Sitting head turns x5 2  14.Sitting head nods x5 2  15. In stance, 180 turn to L    16. In stance, 180 turn to R                                                                                                             TREATMENT  NMR:  -NPC with weighted dowels at ~20cm   -progressed to toothpicks  -seated accommodation Shiela Chart   -progressed to standing on A/P rockerboard   -progressed further to L head turns -bending down, picking up playing cards placing on velcro shelf  -most provoked by bending down and turning up to the R  -3/5 dizziness afterward  -simulating putting golf swing on solid floor Hart Chart to L    -progressed to standing on Airex   -onset of dizziness requesting to stop  -crouching down at ballet bars moving squigz from lowest position to highest   -onset of dizziness requiring multiple ret breaks throughout   PATIENT EDUCATION: Education details: additions to HEP for 2nd bead of brock string, bending down habituation  Person educated: Patient Education method: Explanation, Demonstration, and Handouts Education comprehension: verbalized understanding and needs further education  HOME EXERCISE PROGRAM: Access Code: [email or web address] URL: [email or web address] Date: 03/11/2024 Prepared by: Sheffield Senate  Exercises - Seated VOR Cancellation  - 3 x daily - 7 x weekly - 3 sets - 30s hold - Brock String  - 3 x daily - 7 x weekly - 2 sets - 5 reps (just focusing on first bead)  Gaze Stabilization: Sitting    Keeping eyes on target on wall 5 feet away, tilt head down 15-30 and move head side to side for __30__ seconds. Repeat while moving head up and down for __30__ seconds. Do __3__ sessions per day.  GOALS: Goals reviewed with patient? Yes  SHORT TERM GOALS: Target date: 04/01/24  Pt will be independent with initial HEP for improved symptom report   Baseline: to be updated Goal status: INITIAL  2.  Patient will demonstrate improved convergence to </= 15cm for improved binocular function Baseline: 25cm Goal status: INITIAL  3.  MCTSIB goal  Baseline: LTG written  Goal status: MET  4. Patient will score </=50/100 on the DHI to demonstrate improvement in symptom report and reduced handicap related to dizziness  Baseline: 60/100  Goal status: INITIAL   5. Patient will score </= 38/100 on the VM- PATHI to demonstrate improvement in symptom report   Baseline: 55/100  Goal status: INITIAL  LONG TERM GOALS:  Target date: 04/29/24  Pt will be independent with final HEP for improved symptom report   Baseline: to be updated Goal status: INITIAL  2.  Patient  will improve convergence to </= 10cm to demonstrate improved binocular function Baseline: 25cm Goal status: INITIAL  3.  Pt will be able to perform condition 4 of mCTSIB with no reports of dizziness.  Baseline: mild/mod dizziness  Goal status: INITIAL  4.  Patient will improve DHI score to </= 35/100 to demonstrate improved symptom report and reduction in handicap related to dizziness Baseline: 60/100 Goal status: INITIAL  5.  Patient will improve VM-PATHI score to </= 17/100 to demonstrate reduction in symptom report  Baseline: 55/100 Goal status: INITIAL   ASSESSMENT:  CLINICAL IMPRESSION: Patient seen for skilled PT session with emphasis on vestibular retraining. Continues to be most provoked by accommodation tasks, esp with L head turns. Progression to varying surfaces (unstable, NB, etc) for further vestibular involvement and challenge tolerated fairly well. Continue POC.   OBJECTIVE IMPAIRMENTS: dizziness.   ACTIVITY LIMITATIONS: bending, sitting, standing, bed mobility, and locomotion level  PARTICIPATION LIMITATIONS: interpersonal relationship, driving, shopping, community activity, occupation, yard work, and school  PERSONAL FACTORS: Age, Fitness, Past/current experiences, Profession, and Time since onset of injury/illness/exacerbation are also affecting patient's functional outcome.   REHAB POTENTIAL: Good  CLINICAL DECISION MAKING: Evolving/moderate complexity  EVALUATION COMPLEXITY: Moderate   PLAN:  PT FREQUENCY: 2x/week  PT DURATION: 6 weeks  PLANNED INTERVENTIONS: 97164- PT Re-evaluation, 97750- Physical Performance Testing, 97110-Therapeutic exercises, 97530- Therapeutic activity, [id number]- Neuromuscular re-education, 97535- Self Care, 02859- Manual therapy, [phone]- Gait training, [phone]- Orthotic Initial, [phone]- Orthotic/Prosthetic subsequent, [phone]- Canalith repositioning, [phone]- Aquatic Therapy, Patient/Family education, Balance training, Stair training,  Vestibular training, Visual/preceptual remediation/compensation, Cognitive remediation, and DME instructions  PLAN FOR NEXT SESSION: progress VOR, VOR cancellation, NPC, brock string (how was progression to 2nd bead?) balance with EC, head motions as pt can tolerate, habituation bending down, accommodation tasks    Delon DELENA Pop, PT, DPT, CBIS 03/16/24 12:59 PM

## 2024-03-16 NOTE — Patient Instructions (Addendum)
 Rhinitis  Return for allergy skin testing. Will make additional recommendations based on results. Make sure you don't take any antihistamines for 3 days before the skin testing appointment. Don't put any lotion on the back and arms on the day of testing.  Must be in good health and not ill. No vaccines/injections/antibiotics within the past 7 days.  Plan on being here for 30-60 minutes.  Use Flonase (fluticasone) nasal spray 1-2 sprays per nostril once a day as needed for nasal congestion.  Nasal saline spray (i.e., Simply Saline) or nasal saline lavage (i.e., NeilMed) is recommended as needed and prior to medicated nasal sprays.  Food  Return for select peanut and tree nut testing. Continue to avoid for now. For mild symptoms you can take over the counter antihistamines (zyrtec 10mg  to 20mg ) and monitor symptoms closely.  If symptoms worsen or if you have severe symptoms including breathing issues, throat closure, significant swelling, whole body hives, severe diarrhea and vomiting, lightheadedness then seek immediate medical care.  Follow up for skin testing.

## 2024-03-23 ENCOUNTER — Ambulatory Visit: Attending: Neurology | Admitting: Physical Therapy

## 2024-03-23 ENCOUNTER — Telehealth: Payer: Self-pay | Admitting: Physical Therapy

## 2024-03-23 DIAGNOSIS — R2681 Unsteadiness on feet: Secondary | ICD-10-CM | POA: Insufficient documentation

## 2024-03-23 DIAGNOSIS — R42 Dizziness and giddiness: Secondary | ICD-10-CM | POA: Insufficient documentation

## 2024-03-23 NOTE — Telephone Encounter (Signed)
 Attempted to call pt regarding no show appt, but pt did not have identifiable voicemail, so unable to leave message.  Sheffield Senate, PT, DPT 03/23/24 10:37 AM   [facility] [address] [place], [place]  [address] Phone:  [phone] Fax:  [phone]

## 2024-03-24 NOTE — Progress Notes (Unsigned)
 Skin testing note  RE: Collin Gutierrez MRN: [id number] DOB: 04-11-1996 Date of Office Visit: 03/25/2024  Referring provider: [name], MD Primary care provider: [name], MD  Chief Complaint: skin testing  History of Present Illness: I had the pleasure of seeing Collin Gutierrez for a skin testing visit at the [facility] on 03/25/2024. He is a 28 y.o. male, who is being followed for allergic rhinitis and adverse food reaction. His previous allergy  office visit was on 03/16/2024 with Dr. Luke. Today is a skin testing visit.   Discussed the use of AI scribe software for clinical note transcription with the patient, who gave verbal consent to proceed.    He experiences persistent year-round allergy  symptoms, including nasal congestion and inflammation. These symptoms have been consistent throughout the year.  He has episodes of vertigo. Physical therapy over the last few weeks has been beneficial in managing his vertigo.  He experienced a mild allergic reaction to peanuts, characterized by itching in the mouth after consuming salted peanuts. He had no prior issues with peanuts before this incident and has not experienced severe reactions such as swelling, vomiting, or the need for emergency care.     Assessment and Plan: Collin Gutierrez is a 28 y.o. male with: Other allergic rhinitis Allergic rhinitis due to dust mite Past history - Chronic allergic rhinitis with year-round symptoms, worsened seasonally and recently having vertigo. No prior allergy  testing. Flonase effective but not regularly used. Today's skin testing positive to dust mites only.  Start environmental control measures as below. Use over the counter antihistamines such as Zyrtec (cetirizine), Claritin (loratadine), Allegra (fexofenadine), or Xyzal (levocetirizine) daily as needed. May take twice a day during allergy  flares. May switch antihistamines every few months. Use Flonase (fluticasone)  nasal spray 1-2 sprays per nostril once a day as needed for nasal congestion.  Nasal saline spray (i.e., Simply Saline) or nasal saline lavage (i.e., NeilMed) is recommended as needed and prior to medicated nasal sprays. Consider allergy  injections for long term control if above medications do not help the symptoms - handout given.  Also can consider odactra sublingual treatments.   Other adverse food reactions, not elsewhere classified, initial encounter Past history - perioral symptoms recently with peanut ingestion. Now avoiding all peanuts and tree nuts.  Today's skin prick testing negative to peanuts and tree nuts.  Okay to eat tree nuts and peanuts at home. If you have any issues then stop and let us  know.  For mild symptoms you can take over the counter antihistamines (zyrtec 10mg  to 20mg ) and monitor symptoms closely.  If symptoms worsen or if you have severe symptoms including breathing issues, throat closure, significant swelling, whole body hives, severe diarrhea and vomiting, lightheadedness then seek immediate medical care.  Return in about 1 year (around 03/25/2025).  No orders of the defined types were placed in this encounter.  Lab Orders  No laboratory test(s) ordered today    Diagnostics: Skin Testing: Environmental allergy  panel and select foods. Today's skin testing positive to dust mites only.  Negative to other environmental allergies.  Negative to peanuts and tree nuts.   Results discussed with patient/family.  Airborne Adult Perc - 03/25/24 1300     Time Antigen Placed 1339    Allergen Manufacturer Jestine    Location Back    Number of Test 55    Panel 1 Select    1. Control-Buffer 50% Glycerol Negative    2. Control-Histamine 2+  3. Bahia Negative    4. French Southern Territories Negative    5. Johnson Negative    6. [place]  Blue Negative    7. Meadow Fescue Negative    8. Perennial Rye Negative    9. Timothy Negative    10. Ragweed Mix Negative    11. Cocklebur  Negative    12. Plantain,  English Negative    13. Baccharis Negative    14. Dog Fennel Negative    15. Russian Thistle Negative    16. Lamb's Quarters Negative    17. Sheep Sorrell Negative    18. Rough Pigweed Negative    19. Marsh Elder, Rough Negative    20. Mugwort, Common Negative    21. Box, Elder Negative    22. Cedar, red Negative    23. Sweet Gum Negative    24. Pecan Pollen Negative    25. Pine Mix Negative    26. Walnut, Black Pollen Negative    27. Red Mulberry Negative    28. Ash Mix Negative    29. Birch Mix Negative    30. Beech American Negative    31. Cottonwood, Guinea-Bissau Negative    32. Hickory, White Negative    33. Maple Mix Negative    34. Oak, Guinea-Bissau Mix Negative    35. Sycamore Eastern Negative    36. Alternaria Alternata Negative    37. Cladosporium Herbarum Negative    38. Aspergillus Mix Negative    39. Penicillium Mix Negative    40. Bipolaris Sorokiniana (Helminthosporium) Negative    42. Mucor Plumbeus Negative    43. Fusarium Moniliforme Negative    44. Aureobasidium Pullulans (pullulara) Negative    45. Rhizopus Oryzae Negative    46. Botrytis Cinera Negative    47. Epicoccum Nigrum Negative    48. Phoma Betae Negative    49. Dust Mite Mix 4+    50. Cat Hair 10,000 BAU/ml Negative    51.  Dog Epithelia Negative    52. Mixed Feathers Negative    53. Horse Epithelia Negative    54. Cockroach, German Negative    55. Tobacco Leaf Negative          Intradermal - 03/25/24 1443     Time Antigen Placed 1443    Allergen Manufacturer Jestine    Location Arm    Number of Test 15    Control Negative    Bahia Negative    French Southern Territories Negative    Johnson Negative    7 Grass Negative    Ragweed Mix Negative    Weed Mix Negative    Tree Mix Negative    Mold 1 Negative    Mold 2 Negative    Mold 3 Negative    Mold 4 Negative    Cat Negative    Dog Negative    Cockroach Negative    Other Negative          Food Adult Perc - 03/25/24 1400      Time Antigen Placed 1408    Allergen Manufacturer Greer    Location Back    Number of allergen test 8    1. Peanut Negative    10. Cashew Negative    11. Walnut Food Negative    12. Almond Negative    13. Hazelnut Negative    14. Pecan Food Negative    15. Pistachio Negative    16. [place] Nut Negative          Previous notes and tests  were reviewed. The plan was reviewed with the patient/family, and all questions/concerned were addressed.  It was my pleasure to see Collin Gutierrez today and participate in his care. Please feel free to contact me with any questions or concerns.  Sincerely,  Orlan Cramp, DO Allergy  & Immunology  [facility]  [place] office: [phone] [facility] office: [phone]

## 2024-03-25 ENCOUNTER — Ambulatory Visit

## 2024-03-25 ENCOUNTER — Ambulatory Visit: Admitting: Allergy

## 2024-03-25 ENCOUNTER — Encounter: Payer: Self-pay | Admitting: Allergy

## 2024-03-25 DIAGNOSIS — J3089 Other allergic rhinitis: Secondary | ICD-10-CM | POA: Diagnosis not present

## 2024-03-25 DIAGNOSIS — T781XXD Other adverse food reactions, not elsewhere classified, subsequent encounter: Secondary | ICD-10-CM | POA: Diagnosis not present

## 2024-03-25 NOTE — Patient Instructions (Addendum)
 Today's skin testing positive to dust mites only.  Negative to other environmental allergies.  Negative to peanuts and tree nuts.   Results given.  Environmental allergies Start environmental control measures as below. Use over the counter antihistamines such as Zyrtec (cetirizine), Claritin (loratadine), Allegra (fexofenadine), or Xyzal (levocetirizine) daily as needed. May take twice a day during allergy  flares. May switch antihistamines every few months. Use Flonase (fluticasone) nasal spray 1-2 sprays per nostril once a day as needed for nasal congestion.  Nasal saline spray (i.e., Simply Saline) or nasal saline lavage (i.e., NeilMed) is recommended as needed and prior to medicated nasal sprays. Consider allergy  injections for long term control if above medications do not help the symptoms - handout given.  Also can consider odactra sublingual treatments.   Food Okay to eat tree nuts and peanuts at home. If you have any issues then stop and let us  know.  For mild symptoms you can take over the counter antihistamines (zyrtec 10mg  to 20mg ) and monitor symptoms closely.  If symptoms worsen or if you have severe symptoms including breathing issues, throat closure, significant swelling, whole body hives, severe diarrhea and vomiting, lightheadedness then seek immediate medical care.  Return in about 1 year (around 03/25/2025). Or sooner if needed.    Control of House Dust Mite Allergen Dust mite allergens are a common trigger of allergy  and asthma symptoms. While they can be found throughout the house, these microscopic creatures thrive in warm, humid environments such as bedding, upholstered furniture and carpeting. Because so much time is spent in the bedroom, it is essential to reduce mite levels there.  Encase pillows, mattresses, and box springs in special allergen-proof fabric covers or airtight, zippered plastic covers.  Bedding should be washed weekly in hot water (130 F) and dried  in a hot dryer. Allergen-proof covers are available for comforters and pillows that can't be regularly washed.  Wash the allergy -proof covers every few months. Minimize clutter in the bedroom. Keep pets out of the bedroom.  Keep humidity less than 50% by using a dehumidifier or air conditioning. You can buy a humidity measuring device called a hygrometer to monitor this.  If possible, replace carpets with hardwood, linoleum, or washable area rugs. If that's not possible, vacuum frequently with a vacuum that has a HEPA filter. Remove all upholstered furniture and non-washable window drapes from the bedroom. Remove all non-washable stuffed toys from the bedroom.  Wash stuffed toys weekly.

## 2024-03-30 ENCOUNTER — Ambulatory Visit

## 2024-03-30 DIAGNOSIS — R2681 Unsteadiness on feet: Secondary | ICD-10-CM

## 2024-03-30 DIAGNOSIS — R42 Dizziness and giddiness: Secondary | ICD-10-CM | POA: Diagnosis not present

## 2024-03-30 NOTE — Therapy (Signed)
 OUTPATIENT PHYSICAL THERAPY VESTIBULAR TREATMENT     Patient Name: Collin Gutierrez MRN: [id number] DOB:1996-01-10, 28 y.o., male Today's Date: 03/30/2024  END OF SESSION:  PT End of Session - 03/30/24 1109     Visit Number 4    Number of Visits 13    Date for PT Re-Evaluation 04/29/24    Authorization Type BCBS    PT Start Time 1105    PT Stop Time 1145    PT Time Calculation (min) 40 min    Activity Tolerance Patient tolerated treatment well    Behavior During Therapy [facility] for tasks assessed/performed          Past Medical History:  Diagnosis Date   Vertigo    Past Surgical History:  Procedure Laterality Date   NO PAST SURGERIES     Patient Active Problem List   Diagnosis Date Noted   Benign paroxysmal positional vertigo 03/16/2024   Seasonal allergies 03/16/2024   Ventricular premature depolarization 03/16/2024   Vertigo 02/15/2024   Glenoid labrum tear 09/11/2021   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 Is tired, but reports improvements in his dizziness. Did have a lot of assignments due last night and so was staring at a computer screen for a long time and when he looked up he felt dizzy. Denies falls.   Pt accompanied by: self  PERTINENT HISTORY: 2x concussions ~7 years ago  PAIN:  Are you having pain? No  PRECAUTIONS: Fall   PATIENT GOALS: to not be dizzy  OBJECTIVE:  Note: Objective measures were completed at Evaluation unless otherwise noted.  DIAGNOSTIC FINDINGS: 04/16/17 head  CT IMPRESSION: Normal head CT.   PATIENT SURVEYS:  DHI: THE DIZZINESS HANDICAP INVENTORY (DHI)  P1. Does looking up increase your problem? 4 = Yes  E2. Because of your problem, do you feel frustrated? 4 = Yes  F3. Because of your problem, do you restrict your travel for business or recreation?  2 = Sometimes  P4. Does walking down the aisle of a supermarket increase your problems?  0 = No  F5. Because of your problem, do you have difficulty getting into or out of bed?  2 = Sometimes  F6. Does your problem significantly restrict your participation in social activities, such as going out to dinner, going to the movies, dancing, or going to parties? 0 = No  F7. Because of your problem, do you have difficulty reading?  2 = Sometimes  P8. Does performing more ambitious activities such as sports, dancing, household chores (sweeping or putting dishes away) increase your problems?  4 = Yes  E9. Because of your problem, are you afraid to leave your home without having without having someone accompany you?  2 = Sometimes  E10. Because of your problem have you been embarrassed in front of others?  2 = Sometimes  P11. Do quick movements of your head increase your problem?  2 = Sometimes  F12. Because of your problem, do you avoid heights?  0 = No  P13. Does turning over in bed increase your problem?  2 = Sometimes  F14. Because of your problem, is it difficult for you to do strenuous homework or yard work? 2 = Sometimes  E15. Because of your problem, are you afraid people may think you are intoxicated? 0 = No  F16. Because of your problem, is it difficult for you to go for a walk by yourself?  0 = No  P17. Does walking down a sidewalk increase your problem?  0 = No  E18.Because of your problem, is it difficult for you to concentrate 2 = Sometimes  F19. Because of your problem, is it difficult for you to walk around your house in the dark? 2 = Sometimes  E20. Because of your problem, are you afraid to  stay home alone?  2 = Sometimes  E21. Because of your problem, do you feel handicapped? 2 = Sometimes  E22. Has the problem placed stress on your relationships with members of your family or friends? 0 = No  E23. Because of your problem, are you depressed?  2 = Sometimes  F24. Does your problem interfere with your job or household responsibilities?  2 = Sometimes  P25. Does bending over increase your problem?  4 = Yes  TOTAL 44/100    DHI Scoring Instructions  The patient is asked to answer each question as it pertains to dizziness or unsteadiness problems, specifically  considering their condition during the last month. Questions are designed to incorporate functional (F), physical  (P), and emotional (E) impacts on disability.   Scores greater than 10 points should be referred to balance specialists for further evaluation.   16-34 Points (mild handicap)  36-52 Points (moderate handicap)  54+ Points (severe handicap)  Minimally Detectable Change: 17 points ([address] [place], 1990)  [place], G. SHAUNNA. and [place], C. W. (1990). The development of the Dizziness Handicap Inventory. Archives of Otolaryngology - Head and Neck Surgery 116(4): [id number].   VM- PATHI: 40  VESTIBULAR ASSESSMENT:  GENERAL OBSERVATION: NAD, no AD   SYMPTOM BEHAVIOR:  Subjective history: see above  Non-Vestibular symptoms: changes in hearing, changes in vision, headaches, tinnitus, and nausea/vomiting  Type of dizziness: Imbalance (Disequilibrium) and drunk  Frequency: daily  Duration: days  Aggravating factors: Spontaneous, Induced by position change: rolling to the right and rolling to the left, Induced by motion: looking up at the ceiling, bending down to the ground, driving, and activity in general, Worse with fatigue, Worse in the morning, Worse outside or in busy environment, Occurs when standing still , and Moving eyes  Relieving factors: medication and rest  Progression of symptoms:  worse  OCULOMOTOR EXAM:  Ocular Alignment: normal wears glasses  Ocular ROM: No Limitations  Spontaneous Nystagmus: absent  Gaze-Induced Nystagmus: absent  Smooth Pursuits: intact 3/5  Saccades: intact1-2/5 horizontal   Convergence/Divergence: ~25 cm    VESTIBULAR - OCULAR REFLEX:   Slow VOR: Normal  VOR Cancellation: Unable to Maintain Gaze  Head-Impulse Test: HIT Right: negative HIT Left: positive  Dynamic Visual Acuity: TBA   POSITIONAL TESTING: Right Dix-Hallpike: no nystagmus and 2/5 dizziness Left Dix-Hallpike: no nystagmus Right Roll Test: no nystagmus Left Roll Test: no nystagmus  MOTION SENSITIVITY:  Motion Sensitivity Quotient Intensity: 0 = none, 1 = Lightheaded, 2 = Mild, 3 = Moderate, 4 = Severe, 5 = Vomiting  Intensity  1. Sitting to supine 0  2. Supine to L side 2  3. Supine to R side 2  4. Supine to sitting 0  5. L Hallpike-Dix 2  6. Up from L  0  7. R Hallpike-Dix 2  8. Up from R  0  9. Sitting, head tipped to L knee 2  10. Head up from L knee 2-3  11. Sitting, head tipped to R knee 0  12. Head up from R knee 2  13. Sitting head turns x5 2  14.Sitting head nods x5 2  15. In stance, 180 turn to L    16. In stance, 180 turn to R                                                                                                             TREATMENT Theract: -STG assessment  -Convergence: 15cm -DHI: 44/100 -VM-PATHI: 40/100  NMR: -Accommodation Hart chart set at 45ft away -DVA practice standing on dense foam horizontal head turns and vertical head nods  -vertical more provoking  -dual tasking with challenge to peripheral vision and scanning   -played Spot it! Standing on dense foam with 4 blaze pods placed peripherally   -reduction in dizziness with greater time spent of task, but task fatigue noted with slower reaction time -lateral turn + toss at rebounder with weighted ball   -more provoking turning to the R> L   PATIENT  EDUCATION: Education details: exam findings, add 3rd bead on brock string Person educated: Patient Education method: Explanation, Demonstration, and Handouts Education comprehension: verbalized understanding and needs further education  HOME EXERCISE PROGRAM: Access Code: [email or web address] URL: [email or web address] Date: 03/11/2024 Prepared by: Sheffield Senate  Exercises - Seated VOR Cancellation  - 3 x daily - 7 x weekly - 3 sets - 30s hold - Brock String  - 3 x daily - 7 x weekly - 2 sets - 5 reps (just focusing on first bead)  Gaze Stabilization: Sitting    Keeping eyes on target on wall 5 feet away, tilt head down 15-30 and move head side to side for __30__ seconds. Repeat while moving head up and down for __30__ seconds. Do __3__ sessions per day.  GOALS: Goals reviewed with patient? Yes  SHORT TERM GOALS: Target date: 04/01/24  Pt will be independent with initial HEP for improved symptom report   Baseline: to be updated; updated Goal status: MET  2.  Patient will demonstrate improved convergence to </= 15cm for improved binocular function Baseline: 25cm; 15cm  Goal status: MET  3.  MCTSIB goal  Baseline: LTG written  Goal status: MET  4. Patient will score </=50/100 on the DHI to demonstrate improvement in symptom report and reduced handicap related to dizziness  Baseline: 60/100; 44/100  Goal status: MET   5. Patient will score </= 38/100 on the VM- PATHI to demonstrate improvement in symptom report   Baseline: 55/100; 40/100  Goal status: NOT MET  LONG TERM GOALS: Target date: 04/29/24  Pt will be independent with final HEP for improved symptom report   Baseline: to be updated Goal status: INITIAL  2.  Patient will improve convergence to </= 10cm to demonstrate improved binocular function Baseline: 25cm Goal status: INITIAL  3.  Pt will be  able to perform condition 4 of mCTSIB with no reports of dizziness.  Baseline: mild/mod dizziness  Goal  status: INITIAL  4.  Patient will improve DHI score to </= 35/100 to demonstrate improved symptom report and reduction in handicap related to dizziness Baseline: 60/100 Goal status: INITIAL  5.  Patient will improve VM-PATHI score to </= 17/100 to demonstrate reduction in symptom report  Baseline: 55/100 Goal status: INITIAL   ASSESSMENT:  CLINICAL IMPRESSION: Patient seen for skilled PT session with emphasis on STG assessment and vestibular retraining. He met 4/5 STG and is progressing well toward remaining goal. His DHI score has improved to reflect a moderate handicap where previously it was a severe handicap. His VM-PATHI score has also improved to reflect a lower degree of disability. Convergence remains abnormal, but has improved indicating improved binocular functioning. He continues to be most challenged by R turns/head movements, dynamic visual tasks and accommodation tasks. Continue POC.    OBJECTIVE IMPAIRMENTS: dizziness.   ACTIVITY LIMITATIONS: bending, sitting, standing, bed mobility, and locomotion level  PARTICIPATION LIMITATIONS: interpersonal relationship, driving, shopping, community activity, occupation, yard work, and school  PERSONAL FACTORS: Age, Fitness, Past/current experiences, Profession, and Time since onset of injury/illness/exacerbation are also affecting patient's functional outcome.   REHAB POTENTIAL: Good  CLINICAL DECISION MAKING: Evolving/moderate complexity  EVALUATION COMPLEXITY: Moderate   PLAN:  PT FREQUENCY: 2x/week  PT DURATION: 6 weeks  PLANNED INTERVENTIONS: 97164- PT Re-evaluation, 97750- Physical Performance Testing, 97110-Therapeutic exercises, 97530- Therapeutic activity, [id number]- Neuromuscular re-education, 97535- Self Care, [id number]- Manual therapy, [phone]- Gait training, [phone]- Orthotic Initial, [id number]- Orthotic/Prosthetic subsequent, [phone]- Canalith repositioning, [phone]- Aquatic Therapy, Patient/Family education, Balance training, Stair  training, Vestibular training, Visual/preceptual remediation/compensation, Cognitive remediation, and DME instructions  PLAN FOR NEXT SESSION: progress VOR, VOR cancellation, NPC, brock string ,balance with EC, head motions as pt can tolerate, habituation bending down, accommodation tasks, emphasis on R turns    Delon DELENA Pop, PT, DPT, CBIS 03/30/24 11:55 AM

## 2024-04-01 ENCOUNTER — Ambulatory Visit: Admitting: Physical Therapy

## 2024-04-06 ENCOUNTER — Ambulatory Visit

## 2024-04-08 ENCOUNTER — Ambulatory Visit

## 2024-04-13 ENCOUNTER — Ambulatory Visit

## 2024-04-13 DIAGNOSIS — R2681 Unsteadiness on feet: Secondary | ICD-10-CM

## 2024-04-13 DIAGNOSIS — R42 Dizziness and giddiness: Secondary | ICD-10-CM

## 2024-04-13 NOTE — Therapy (Unsigned)
 OUTPATIENT PHYSICAL THERAPY VESTIBULAR TREATMENT     Patient Name: Collin Gutierrez MRN: [id number] DOB:October 28, 1995, 28 y.o., male Today's Date: 04/14/2024  END OF SESSION:  PT End of Session - 04/13/24 1025     Visit Number 5    Number of Visits 13    Date for Recertification  04/29/24    Authorization Type BCBS    PT Start Time 1023   patient late   PT Stop Time 1102    PT Time Calculation (min) 39 min    Activity Tolerance Patient tolerated treatment well    Behavior During Therapy [facility] for tasks assessed/performed          Past Medical History:  Diagnosis Date   Vertigo    Past Surgical History:  Procedure Laterality Date   NO PAST SURGERIES     Patient Active Problem List   Diagnosis Date Noted   Benign paroxysmal positional vertigo 03/16/2024   Seasonal allergies 03/16/2024   Ventricular premature depolarization 03/16/2024   Vertigo 02/15/2024   Glenoid labrum tear 09/11/2021   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 Was sick over the past 2 weeks and so was not here. Continues to report difficulty with accommodation with course work. Denies falls. Golfing has been better, symptom-wise. Of note, does have a soccer game tomorrow.   Pt accompanied by: self  PERTINENT HISTORY: 2x concussions ~7 years ago  PAIN:  Are you having pain? No  PRECAUTIONS: Fall   PATIENT GOALS: to not be dizzy  OBJECTIVE:  Note: Objective measures were completed at Evaluation unless otherwise noted.  DIAGNOSTIC  FINDINGS: 04/16/17 head CT IMPRESSION: Normal head CT.                                                                TREATMENT NMR: -brock string, tolerated up to 2 ladders -monocular accommodation due to L eye suppressing with Lucious string -monocular smooth pursuits (no problems)  -divided attention task tapping toes to color called out vinyl dots on floor + sorting animals into matching color dishes   - prolonged time on task resulted in increased dizziness and increased # of errors   PATIENT EDUCATION: Education details: continue HEP, added monocular accommodation  Person educated: Patient Education method: Explanation, Demonstration, and Handouts Education comprehension: verbalized understanding and needs further education  HOME EXERCISE PROGRAM: Access Code: [email or web address] URL: [email or web address] Date: 03/11/2024 Prepared by: Sheffield Senate  Exercises - Seated VOR Cancellation  - 3 x daily - 7 x weekly - 3 sets - 30s hold - Brock String  - 3 x daily - 7 x weekly - 2 sets - 5 reps (just focusing on first bead)  Gaze Stabilization: Sitting    Keeping eyes on target on wall 5 feet away, tilt head down 15-30 and move head side to side for __30__ seconds. Repeat while moving head up and  down for __30__ seconds. Do __3__ sessions per day.  -monocular accommodation   GOALS: Goals reviewed with patient? Yes  SHORT TERM GOALS: Target date: 04/01/24  Pt will be independent with initial HEP for improved symptom report   Baseline: to be updated; updated Goal status: MET  2.  Patient will demonstrate improved convergence to </= 15cm for improved binocular function Baseline: 25cm; 15cm  Goal status: MET  3.  MCTSIB goal  Baseline: LTG written  Goal status: MET  4. Patient will score </=50/100 on the DHI to demonstrate improvement in symptom report and reduced handicap related to dizziness  Baseline: 60/100; 44/100  Goal status: MET   5. Patient will score  </= 38/100 on the VM- PATHI to demonstrate improvement in symptom report   Baseline: 55/100; 40/100  Goal status: NOT MET  LONG TERM GOALS: Target date: 04/29/24  Pt will be independent with final HEP for improved symptom report   Baseline: to be updated Goal status: INITIAL  2.  Patient will improve convergence to </= 10cm to demonstrate improved binocular function Baseline: 25cm Goal status: INITIAL  3.  Pt will be able to perform condition 4 of mCTSIB with no reports of dizziness.  Baseline: mild/mod dizziness  Goal status: INITIAL  4.  Patient will improve DHI score to </= 35/100 to demonstrate improved symptom report and reduction in handicap related to dizziness Baseline: 60/100 Goal status: INITIAL  5.  Patient will improve VM-PATHI score to </= 17/100 to demonstrate reduction in symptom report  Baseline: 55/100 Goal status: INITIAL   ASSESSMENT:  CLINICAL IMPRESSION: Patient seen for skilled PT session with emphasis on vestibular retraining. Noted continued difficulty with convergence and accommodation with noted L eye suppression with near tasks. Majority of exercises completed monocularly to allow for individual strengthening of L eye. Increased challenge and onset of symptoms with divided attention tasks that also targeted saccades, habituation to vertical head nods, dual cog + motor task. Continue POC.   OBJECTIVE IMPAIRMENTS: dizziness.   ACTIVITY LIMITATIONS: bending, sitting, standing, bed mobility, and locomotion level  PARTICIPATION LIMITATIONS: interpersonal relationship, driving, shopping, community activity, occupation, yard work, and school  PERSONAL FACTORS: Age, Fitness, Past/current experiences, Profession, and Time since onset of injury/illness/exacerbation are also affecting patient's functional outcome.   REHAB POTENTIAL: Good  CLINICAL DECISION MAKING: Evolving/moderate complexity  EVALUATION COMPLEXITY: Moderate   PLAN:  PT FREQUENCY:  2x/week  PT DURATION: 6 weeks  PLANNED INTERVENTIONS: 97164- PT Re-evaluation, 97750- Physical Performance Testing, 97110-Therapeutic exercises, 97530- Therapeutic activity, [id number]- Neuromuscular re-education, 97535- Self Care, 02859- Manual therapy, [phone]- Gait training, [phone]- Orthotic Initial, [phone]- Orthotic/Prosthetic subsequent, [phone]- Canalith repositioning, [phone]- Aquatic Therapy, Patient/Family education, Balance training, Stair training, Vestibular training, Visual/preceptual remediation/compensation, Cognitive remediation, and DME instructions  PLAN FOR NEXT SESSION: progress VOR, VOR cancellation, NPC, brock string ,balance with EC, head motions as pt can tolerate, habituation bending down, accommodation tasks, emphasis on R turns, how was soccer game? Likely re-cert and focus on accommodation and saccades monocularly    Delon DELENA Pop, PT, DPT, CBIS 04/14/24 7:35 AM

## 2024-04-15 ENCOUNTER — Ambulatory Visit

## 2024-04-15 DIAGNOSIS — R42 Dizziness and giddiness: Secondary | ICD-10-CM | POA: Diagnosis not present

## 2024-04-15 DIAGNOSIS — R2681 Unsteadiness on feet: Secondary | ICD-10-CM

## 2024-04-15 NOTE — Therapy (Signed)
 OUTPATIENT PHYSICAL THERAPY VESTIBULAR TREATMENT/ RE-CERT     Patient Name: Collin Gutierrez MRN: [id number] DOB:04/26/1996, 28 y.o., male Today's Date: 04/15/2024  END OF SESSION:  PT End of Session - 04/15/24 1015     Visit Number 6    Number of Visits 21   re-cert   Date for Recertification  89/75/74   re-cert   Authorization Type [employer]    PT Start Time 1015    PT Stop Time 1055    PT Time Calculation (min) 40 min    Activity Tolerance Patient tolerated treatment well    Behavior During Therapy [facility] for tasks assessed/performed          Past Medical History:  Diagnosis Date   Vertigo    Past Surgical History:  Procedure Laterality Date   NO PAST SURGERIES     Patient Active Problem List   Diagnosis Date Noted   Benign paroxysmal positional vertigo 03/16/2024   Seasonal allergies 03/16/2024   Ventricular premature depolarization 03/16/2024   Vertigo 02/15/2024   Glenoid labrum tear 09/11/2021   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 Did go to his soccer game yesterday. Felt dizzy afterward- disoriented, hard to focus, couldn't read signs, but was able to drive. This has since passed, but still feels foggy. Denies falls and additional hits to head.   Pt accompanied by: self  PERTINENT HISTORY: 2x concussions ~7 years ago  PAIN:  Are you having pain? No  PRECAUTIONS: Fall   PATIENT GOALS: to not be dizzy  OBJECTIVE:   Note: Objective measures were completed at Evaluation unless otherwise noted.  DIAGNOSTIC FINDINGS: 04/16/17 head CT IMPRESSION: Normal head CT.                                                                TREATMENT Theract: -DHI: 36/100 -VM-PATHI: 39/100 -for pain relief: ~4 mins on Chirp wheel   NMR: -monocular accommodation with Shiela Chart seated, far placed at 54ft -ONEOK string seated x3 ladders -divided attention task:  -word search for saccades + blaze pods placed laterally for peripheral vision use and/or habituation to head turns   -dual cog+motor task   -11 words, 50 hits in 10 mins    PATIENT EDUCATION: Education details: continue HEP, exam results, PT POC Person educated: Patient Education method: Explanation, Demonstration, and Handouts Education comprehension: verbalized understanding and needs further education  HOME EXERCISE PROGRAM: Access Code: [email or web address] URL: [email or web address] Date: 03/11/2024 Prepared by: Sheffield Senate  Exercises - Seated VOR Cancellation  - 3 x daily - 7 x weekly - 3 sets - 30s hold - Brock String  - 3 x daily - 7 x weekly - 2 sets - 5 reps (just focusing on first bead)  Gaze Stabilization: Sitting    Keeping eyes  on target on wall 5 feet away, tilt head down 15-30 and move head side to side for __30__ seconds. Repeat while moving head up and down for __30__ seconds. Do __3__ sessions per day.  -monocular accommodation   GOALS: Goals reviewed with patient? Yes  SHORT TERM GOALS: Target date: 04/01/24  Pt will be independent with initial HEP for improved symptom report   Baseline: to be updated; updated Goal status: MET  2.  Patient will demonstrate improved convergence to </= 15cm for improved binocular function Baseline: 25cm; 15cm  Goal status: MET  3.  MCTSIB goal  Baseline: LTG written  Goal status: MET  4. Patient will score </=50/100 on the DHI to demonstrate improvement in symptom report  and reduced handicap related to dizziness  Baseline: 60/100; 44/100  Goal status: MET   5. Patient will score </= 38/100 on the VM- PATHI to demonstrate improvement in symptom report   Baseline: 55/100; 40/100  Goal status: NOT MET  LONG TERM GOALS: Target date: 04/29/24  Pt will be independent with final HEP for improved symptom report   Baseline: to be updated; provided Goal status: MET  2.  Patient will improve convergence to </= 10cm to demonstrate improved binocular function Baseline: 25cm; 17cm Goal status: IN PROGRESS  3.  Pt will be able to perform condition 4 of mCTSIB with no reports of dizziness.  Baseline: mild/mod dizziness; none  Goal status: MET  4.  Patient will improve DHI score to </= 35/100 to demonstrate improved symptom report and reduction in handicap related to dizziness Baseline: 60/100; 36/100 Goal status: IN PROGRESS  5.  Patient will improve VM-PATHI score to </= 17/100 to demonstrate reduction in symptom report  Baseline: 55/100; 39/100 Goal status: IN PROGRESS   NEW LONG TERM GOALS: 05/13/24 Pt will be independent with final HEP for improved symptoms report    Baseline: to be updated   Goal status: NEW  2. Patient will improve DHI score to </=20/100 to demonstrate improved symptom report and reduction in handicap related to dizziness   Baseline: 36/100   Goal status: NEW   3. Patient will improve VM-PATHI score to </= 17/100 to demonstrate reduction in symptoms report    Baseline: 39/100    Goal status: NEW   4. Patient will improve convergence to </= 10cm to demonstrate improved binocular vision and NPC.    Baseline: 17cm   Goal status: NEW  ASSESSMENT:  CLINICAL IMPRESSION: Patient seen for skilled PT session with emphasis on goal assessment and re-cert. He met 2/5 LTG and is progressing well toward remaining goals. His convergence continues to improve. When viewing objects closer than ~17cm, L eye becomes suppressed. His DHI  continues to reflect a significant disability due to his dizziness, but this has improved drastically since eval.  His VM-PATHI has also improved nicely. He continues to benefit from skilled PT services to further improve his binocular vision and limit the impacts of his dizziness on daily life.   OBJECTIVE IMPAIRMENTS: dizziness.   ACTIVITY LIMITATIONS: bending, sitting, standing, bed mobility, and locomotion level  PARTICIPATION LIMITATIONS: interpersonal relationship, driving, shopping, community activity, occupation, yard work, and school  PERSONAL FACTORS: Age, Fitness, Past/current experiences, Profession, and Time since onset of injury/illness/exacerbation are also affecting patient's functional outcome.   REHAB POTENTIAL: Good  CLINICAL DECISION MAKING: Evolving/moderate complexity  EVALUATION COMPLEXITY: Moderate   PLAN:  PT FREQUENCY: 2x/week 1-2x a week (re-cert)  PT DURATION: 6 weeks 4 weeks (re-cert)  PLANNED INTERVENTIONS: 02835-  PT Re-evaluation, 97750- Physical Performance Testing, 97110-Therapeutic exercises, 97530- Therapeutic activity, [id number]- Neuromuscular re-education, [phone]- Self Care, 02859- Manual therapy, [phone]- Gait training, [phone]- Orthotic Initial, [phone]- Orthotic/Prosthetic subsequent, [phone]- Canalith repositioning, [phone]- Aquatic Therapy, Patient/Family education, Balance training, Stair training, Vestibular training, Visual/preceptual remediation/compensation, Cognitive remediation, and DME instructions  PLAN FOR NEXT SESSION: progress VOR, VOR cancellation, NPC, brock string ,balance with EC, head motions as pt can tolerate, habituation bending down, accommodation tasks, emphasis on R turns, how was soccer game?  focus on accommodation and saccades monocularly    Delon DELENA Pop, PT, DPT, CBIS 04/15/24 11:44 AM

## 2024-04-25 ENCOUNTER — Ambulatory Visit: Attending: Neurology

## 2024-04-27 ENCOUNTER — Ambulatory Visit

## 2024-05-02 ENCOUNTER — Ambulatory Visit

## 2024-05-04 ENCOUNTER — Ambulatory Visit: Admitting: Physical Therapy

## 2024-05-04 ENCOUNTER — Telehealth: Payer: Self-pay | Admitting: Physical Therapy

## 2024-05-04 NOTE — Telephone Encounter (Signed)
 Called pt regarding no show appt from PT today. This is pt's 4th no show appt in a row and due to no show policy will need to cancel remainder of appts. Left a voicemail for pt regarding this information and that if he does want to return to PT, then will need a new referral to return.  Sheffield Senate, PT, DPT 05/04/24 10:40 AM    [facility] [address] [place], [place]  [address] Phone:  [phone] Fax:  [phone]

## 2024-05-09 ENCOUNTER — Ambulatory Visit

## 2024-05-11 ENCOUNTER — Encounter

## 2024-05-16 ENCOUNTER — Encounter

## 2024-05-18 ENCOUNTER — Encounter

## 2024-05-28 ENCOUNTER — Ambulatory Visit (HOSPITAL_COMMUNITY)
Admission: EM | Admit: 2024-05-28 | Discharge: 2024-05-28 | Disposition: A | Discharge status: Home / Self Care | Attending: Physician Assistant | Admitting: Physician Assistant

## 2024-05-28 ENCOUNTER — Ambulatory Visit (INDEPENDENT_AMBULATORY_CARE_PROVIDER_SITE_OTHER)

## 2024-05-28 ENCOUNTER — Other Ambulatory Visit: Payer: Self-pay

## 2024-05-28 ENCOUNTER — Encounter (HOSPITAL_COMMUNITY): Payer: Self-pay

## 2024-05-28 DIAGNOSIS — M940 Chondrocostal junction syndrome [Tietze]: Secondary | ICD-10-CM

## 2024-05-28 DIAGNOSIS — R0789 Other chest pain: Secondary | ICD-10-CM

## 2024-05-28 DIAGNOSIS — R079 Chest pain, unspecified: Secondary | ICD-10-CM | POA: Diagnosis not present

## 2024-05-28 DIAGNOSIS — R0602 Shortness of breath: Secondary | ICD-10-CM

## 2024-05-28 LAB — POC COVID19/FLU A&B COMBO
Covid Antigen, POC: NEGATIVE
Influenza A Antigen, POC: NEGATIVE
Influenza B Antigen, POC: NEGATIVE

## 2024-05-28 MED ORDER — ALBUTEROL SULFATE HFA 108 (90 BASE) MCG/ACT IN AERS
INHALATION_SPRAY | RESPIRATORY_TRACT | Status: AC
  Filled 2024-05-28: qty 6.7

## 2024-05-28 MED ORDER — ALBUTEROL SULFATE HFA 108 (90 BASE) MCG/ACT IN AERS
2.0000 | INHALATION_SPRAY | Freq: Once | RESPIRATORY_TRACT | Status: AC
  Administered 2024-05-28: 2 via RESPIRATORY_TRACT

## 2024-05-28 MED ORDER — PREDNISONE 20 MG PO TABS: 20.0000 mg | ORAL_TABLET | Freq: Every day | ORAL | 0 refills | Status: AC

## 2024-05-28 NOTE — ED Provider Notes (Signed)
 [facility]    CSN: [id number] Arrival date & time: 05/28/24  1839      History   Chief Complaint No chief complaint on file.   HPI Collin Gutierrez is a 28 y.o. male.   Presents today with a 2-day history of feeling very poorly.  He reports subjective fever/chills, body aches, diarrhea, chest pain, shortness of breath, congestion, sore throat.  He has had an occasional cough but this is not significant.  Denies any vomiting.  He reports that chest discomfort is rated 5 on a 0-10 pain scale, localized to substernal region, described as sharp, worse with palpation, no alleviating factors identified.  He denies any known sick contacts.  He has not had COVID recently.  He has not had COVID or influenza vaccines recently.  He denies any significant past medical history including allergies, asthma, COPD, smoking.  He reports that he is feeling poorly which has increased his health anxiety and he is concerned that there is something seriously wrong.  He denies any recent antibiotics or steroids.  He is having difficulty with daily duties as a result of symptoms.    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 Authorizing Provider  predniSONE (DELTASONE) 20 MG tablet Take 1 tablet (20 mg total) by mouth daily for 5 days. 05/28/24 06/02/24 Yes Marjarie Irion K, PA-C  fluticasone (FLONASE) 50 MCG/ACT nasal spray Place 1 spray  into both nostrils daily. Patient not taking: Reported on 03/16/2024 08/14/23 08/13/24  [provider]    Family History Family History  Problem Relation Age of Onset   Hypertension Father    Migraines Neg Hx     Social History Social History   Tobacco Use   Smoking status: Never   Smokeless tobacco: Never  Vaping Use   Vaping status: Never Used  Substance Use Topics   Alcohol use: Yes    Alcohol/week: 5.0 standard drinks of alcohol    Types: 5 Cans of beer per week   Drug use: No     Allergies   Patient has no known allergies.   Review of Systems Review of Systems  Constitutional:  Positive for activity change, chills, fatigue and fever. Negative for appetite change.  HENT:  Positive for congestion and sore throat. Negative for sinus pressure and sneezing.   Respiratory:  Positive for shortness of breath. Negative for cough (Minimal).   Cardiovascular:  Positive for chest pain. Negative for palpitations and leg swelling.  Gastrointestinal:  Positive for diarrhea. Negative for nausea and vomiting.  Musculoskeletal:  Positive for arthralgias and myalgias.     Physical Exam Triage Vital Signs ED Triage Vitals  Encounter Vitals Group     BP 05/28/24 1856 127/70     Girls Systolic BP Percentile --      Girls Diastolic BP Percentile --      Boys Systolic BP Percentile --  Boys Diastolic BP Percentile --      Pulse Rate 05/28/24 1856 78     Resp 05/28/24 1856 18     Temp 05/28/24 1856 98.4 F (36.9 C)     Temp Source 05/28/24 1856 Oral     SpO2 05/28/24 1856 96 %     Weight --      Height --      Head Circumference --      Peak Flow --      Pain Score 05/28/24 1855 5     Pain Loc --      Pain Education --      Exclude from Growth Chart --    No data found.  Updated Vital Signs BP 127/70   Pulse 78   Temp 98.4 F (36.9 C) (Oral)   Resp 18   SpO2 96%   Visual Acuity Right Eye Distance:   Left Eye Distance:   Bilateral Distance:     Right Eye Near:   Left Eye Near:    Bilateral Near:     Physical Exam Vitals reviewed.  Constitutional:      General: He is awake.     Appearance: Normal appearance. He is well-developed. He is not ill-appearing.     Comments: Very pleasant male appears stated age in no acute distress sitting comfortably in exam room  HENT:     Head: Normocephalic and atraumatic.     Right Ear: Tympanic membrane, ear canal and external ear normal. Tympanic membrane is not erythematous or bulging.     Left Ear: Tympanic membrane, ear canal and external ear normal. Tympanic membrane is not erythematous or bulging.     Nose: Nose normal.     Mouth/Throat:     Pharynx: Uvula midline. No oropharyngeal exudate, posterior oropharyngeal erythema or uvula swelling.  Cardiovascular:     Rate and Rhythm: Normal rate and regular rhythm.     Heart sounds: Normal heart sounds, S1 normal and S2 normal. No murmur heard. Pulmonary:     Effort: Pulmonary effort is normal. No accessory muscle usage or respiratory distress.     Breath sounds: No stridor. Examination of the right-lower field reveals decreased breath sounds. Examination of the left-lower field reveals decreased breath sounds. Decreased breath sounds present. No wheezing, rhonchi or rales.  Chest:     Chest wall: Tenderness present. No deformity or swelling.     Comments: Pain is reproducible on exam. Abdominal:     General: Bowel sounds are normal.     Palpations: Abdomen is soft.     Tenderness: There is no abdominal tenderness.  Neurological:     Mental Status: He is alert.  Psychiatric:        Behavior: Behavior is cooperative.      UC Treatments / Results  Labs (all labs ordered are listed, but only abnormal results are displayed) Labs Reviewed  POC COVID19/FLU A&B COMBO    EKG   Radiology DG Chest 2 View Result Date: 05/28/2024 EXAM: 2 VIEW(S) XRAY OF THE CHEST 05/28/2024 07:38:55 PM COMPARISON: 12/14/2013 CLINICAL HISTORY:  shortness of breath and chest pain FINDINGS: LUNGS AND PLEURA: No focal pulmonary opacity. No pulmonary edema. No pleural effusion. No pneumothorax. HEART AND MEDIASTINUM: No acute abnormality of the cardiac and mediastinal silhouettes. BONES AND SOFT TISSUES: No acute osseous abnormality. IMPRESSION: 1. No acute cardiopulmonary process. Electronically signed by: Pinkie Pebbles MD 05/28/2024 07:39 PM EST RP Workstation: [id number]    Procedures Procedures (including critical care  time)  Medications Ordered in UC Medications  albuterol (VENTOLIN HFA) 108 (90 Base) MCG/ACT inhaler 2 puff (2 puffs Inhalation Given 05/28/24 1924)    Initial Impression / Assessment and Plan / UC Course  I have reviewed the triage vital signs and the nursing notes.  Pertinent labs & imaging results that were available during my care of the patient were reviewed by me and considered in my medical decision making (see chart for details).     Patient is well-appearing, afebrile, nontoxic, nontachycardic.  I suspect that he had a virus that has led to costochondritis.  He does negative for COVID and flu in clinic today.  No evidence of acute infection on physical exam that would warrant initiation of antibiotics.  Patient was concerned that there was something more serious going on and so EKG was obtained though we discussed that his symptoms are most likely musculoskeletal as pain is reproducible on exam.  He has an INTERCHEST score of -1.  EKG showed normal sinus rhythm with ventricular rate of 73 bpm without ischemic changes; no previous to compare.  Chest x-ray was also obtained that showed no acute cardiopulmonary disease.  He was given albuterol which provided improvement of symptoms.  Recommended to use this every 4-6 hours as needed for shortness of breath.  Will also start prednisone 20 mg to help with both costochondritis as well as viral symptoms.  We discussed that he is not to take NSAIDs with this medication.   Discussed that if he has any worsening or changing symptoms he needs to be seen immediately.  Strict return precautions given.  Excuse note provided.  Final Clinical Impressions(s) / UC Diagnoses   Final diagnoses:  Shortness of breath  Costochondritis  Chest wall pain     Discharge Instructions      All of your testing was normal in clinic today.  Use albuterol every 4-6 hours as needed for shortness of breath.  Take prednisone 20 mg for 5 days.  Do not take NSAIDs with this medication including aspirin, ibuprofen/Advil, naproxen/Aleve.  You can use Tylenol /acetaminophen  as needed.  If you have a severe cough, fever, changing or worsening chest pain, shortness of breath despite the medication, feeling like you are going to pass out, passing out, weakness you need to go to the ER.     ED Prescriptions     Medication Sig Dispense Auth. Provider   predniSONE (DELTASONE) 20 MG tablet Take 1 tablet (20 mg total) by mouth daily for 5 days. 5 tablet Skyylar Kopf K, PA-C      PDMP not reviewed this encounter.   Sherrell Rocky POUR, PA-C 05/28/24 2004

## 2024-05-28 NOTE — ED Triage Notes (Signed)
 Pt c/o feeling hot, bodyaches, right sided abdominal bloating, non radiating midsternal chest pain, SOBx2d.

## 2024-05-28 NOTE — Discharge Instructions (Signed)
 All of your testing was normal in clinic today.  Use albuterol every 4-6 hours as needed for shortness of breath.  Take prednisone 20 mg for 5 days.  Do not take NSAIDs with this medication including aspirin, ibuprofen/Advil, naproxen/Aleve.  You can use Tylenol /acetaminophen  as needed.  If you have a severe cough, fever, changing or worsening chest pain, shortness of breath despite the medication, feeling like you are going to pass out, passing out, weakness you need to go to the ER.

## 2024-05-31 DIAGNOSIS — J302 Other seasonal allergic rhinitis: Secondary | ICD-10-CM | POA: Diagnosis not present

## 2024-06-08 ENCOUNTER — Institutional Professional Consult (permissible substitution) (INDEPENDENT_AMBULATORY_CARE_PROVIDER_SITE_OTHER): Payer: Self-pay | Admitting: Otolaryngology

## 2024-06-09 ENCOUNTER — Institutional Professional Consult (permissible substitution) (INDEPENDENT_AMBULATORY_CARE_PROVIDER_SITE_OTHER): Admitting: Otolaryngology

## 2024-06-09 ENCOUNTER — Institutional Professional Consult (permissible substitution) (INDEPENDENT_AMBULATORY_CARE_PROVIDER_SITE_OTHER): Payer: Self-pay | Admitting: Otolaryngology

## 2024-07-07 ENCOUNTER — Encounter (INDEPENDENT_AMBULATORY_CARE_PROVIDER_SITE_OTHER): Payer: Self-pay | Admitting: Otolaryngology

## 2024-07-07 ENCOUNTER — Ambulatory Visit (INDEPENDENT_AMBULATORY_CARE_PROVIDER_SITE_OTHER): Payer: Self-pay | Admitting: Otolaryngology

## 2024-07-07 VITALS — BP 126/86 | HR 96 | Ht 70.0 in | Wt 195.0 lb

## 2024-07-07 DIAGNOSIS — R42 Dizziness and giddiness: Secondary | ICD-10-CM | POA: Diagnosis not present

## 2024-07-07 DIAGNOSIS — R2689 Other abnormalities of gait and mobility: Secondary | ICD-10-CM | POA: Diagnosis not present

## 2024-07-07 MED ORDER — RIZATRIPTAN BENZOATE 10 MG PO TABS: 10.0000 mg | ORAL_TABLET | ORAL | 0 refills | Status: AC | PRN

## 2024-07-07 NOTE — Progress Notes (Unsigned)
 Dear Dr. Ines, Here is my assessment for our mutual patient, Collin Gutierrez. Thank you for allowing me the opportunity to care for your patient. Please do not hesitate to contact me should you have any other questions. Sincerely, Dr. Eldora Gutierrez  Otolaryngology Clinic Note Referring provider: Dr. Ines HPI:  Collin Gutierrez is a 28 y.o. male kindly referred by Dr. Ines for evaluation of vertigo  Initial visit (06/2024): Discussed the use of AI scribe software for clinical note transcription with the patient, who gave verbal consent to proceed.  History of Present Illness Collin Gutierrez is a 28 year old male with recurrent vertigo and imbalance who presents for evaluation of persistent dizziness and disequilibrium.  Over several years he has had episodic vertigo and imbalance, usually with seasonal changes with pressure/temp triggers. These previously resolved on their own but since this spring, his dizziness has become more persistent with only partial improvement and frequent recurrences. He describes constant background dizziness with superimposed episodes lasting from one hour to several days. Vertigo is intermittent during these episodes and he has marked imbalance with movement that limits activity. He has symptom-free intervals, including a few weeks in the past month, but episodes recur suddenly. Triggers include seasonal changes, motion such as looking up from his computer and back down, spicy foods, poor sleep or rest, and anxiety. During flares he has photophobia and phonophobia. He denies nausea or vomiting.  He has intermittent headaches, sometimes described as concussion-like or intoxicated, and occasional occipital pain during dizzy episodes. Symptoms improve with relaxation, leaving crowded environments, lying down, and neck stretching. Vestibular therapy helped somewhat though he attended inconsistently.  A prior brain MRI and sinus imaging were reportedly normal.  Neurology did not find a clear neurologic cause but considered migraine. Current medications include intermittent Flonase and antihistamines such as Claritin which may help some. He has used meclizine in the past with unclear benefit  He denies aural fullness, hearing loss, meniere's sx, otorrhea, or eustachian tube dysfunction symptoms. He has bilateral tinnitus without ear trauma. He does not have frequent sinus infections  H&N Surgery: no Personal or FHx of bleeding dz or anesthesia difficulty: no   AP/AC: no  Tobacco: no  PMHx: Allergies  Independent Review of Additional Tests or Records:  [facility] 04/16/2017 independently interpreted with respect to ears: cuts thick so suboptimal but mastoids and middle ear well aerated; no gross otic capsule or ossicular chain pathology appreciated Labs CMP 02/15/2024: BUN/Cr 14/0.99, Na 140, K 4 Neuro notes reviewed (Dr. Ines) 02/15/2024: dysequilibrium, consider inner ear etiology. Rec 6 day medrol  dosepack  Vestibular PT (2025): note reviewed   MRI Brain 01/26/2024: Cannot see images but:   Neurology notes reviewed  PMH/Meds/All/SocHx/FamHx/ROS:   Past Medical History:  Diagnosis Date   Vertigo      Past Surgical History:  Procedure Laterality Date   NO PAST SURGERIES      Family History  Problem Relation Age of Onset   Hypertension Father    Migraines Neg Hx      Social Connections: Not on file     Current Medications[1]   Physical Exam:   BP 126/86 (BP Location: Left Arm, Patient Position: Sitting, Cuff Size: Large)   Pulse 96   Ht 5' 10 (1.778 m)   Wt 195 lb (88.5 kg)   SpO2 96%   BMI 27.98 kg/m   Salient findings:  CN II-XII intact Given history and complaints, ear microscopy was indicated and performed for evaluation with findings as  below in physical exam section and in procedures; Bilateral EAC clear and TM intact with well pneumatized middle ear spaces Weber 512: mid Rinne 512: AC > BC b/l  DH negative, head  shake negative, fukuda neg Anterior rhinoscopy: Septum intact; bilateral inferior turbinates without significant hypertrophy No lesions of oral cavity/oropharynx No obviously palpable neck masses/lymphadenopathy/thyromegaly No respiratory distress or stridor Gait not broad based  Seprately Identifiable Procedures:  Prior to initiating any procedures, risks/benefits/alternatives were explained to the patient and verbal consent obtained. Procedure: Bilateral ear microscopy using microscope (CPT [phone]) Pre-procedure diagnosis: imbalance, rule out otologic cause Post-procedure diagnosis: same Indication: see above; given patient's otologic complaints and history, for improved and comprehensive examination of external ear and tympanic membrane, bilateral otologic examination using microscope was performed. Prior to proceeding, verbal consent was obtained after discussion of R/B/A  Procedure: Patient was placed semi-recumbent. Both ear canals were examined using the microscope with findings above. Patient tolerated the procedure well.   Impression & Plans:  Collin Gutierrez is a 28 y.o. male with:  1. Imbalance   2. Dizziness    Vestibular Migraine Episodes of dizziness, imbalance, and vertigo with light and sound sensitivity, headaches, and neck pain suggest vestibular migraine. He does not appear to have classic Meniere's sx or otologic sx. Explained condition, triggers, and treatment options. Vest therapy helped somewhat Given persistence, we discussed obtaining HT and if he would like to rule out inner ear cause with formal vestib testing, which he would like to do.  - In interim, Prescribe Maxalt  (rizatriptan ) 10 mg at dizziness onset, repeat in two hours if needed. See if this improves his sx, keep symptom diary - Refer for vestibular testing at [facility] - Schedule hearing test. - Provide [employer] resources on vestibular migraine. F/u in 3 months  See below  regarding exact medications prescribed this encounter including dosages and route: No orders of the defined types were placed in this encounter.     Thank you for allowing me the opportunity to care for your patient. Please do not hesitate to contact me should you have any other questions.  Sincerely, Collin Blanch, MD Otolaryngologist (ENT), [facility] Phone: [phone] Fax: [phone]  07/07/2024, 2:28 PM   MDM:  Level 4 - [phone] Complexity/Problems addressed: mod Data complexity: mod - independent review of notes, lab, test - Morbidity: mod  - Prescription Drug prescribed or managed: y      [1]  Current Outpatient Medications:    fluticasone (FLONASE) 50 MCG/ACT nasal spray, Place 1 spray into both nostrils daily. (Patient not taking: Reported on 07/07/2024), Disp: , Rfl:

## 2024-07-07 NOTE — Patient Instructions (Addendum)
 Take 10mg  of maxalt  when you feel like dizziness is coming on. If no improvement, take another one in 2 hours Balance testing number: [phone]  Good: Vestibular migraine --- [employer]

## 2024-08-19 ENCOUNTER — Ambulatory Visit (INDEPENDENT_AMBULATORY_CARE_PROVIDER_SITE_OTHER): Admitting: Audiology

## 2024-09-20 ENCOUNTER — Ambulatory Visit (INDEPENDENT_AMBULATORY_CARE_PROVIDER_SITE_OTHER): Admitting: Audiology

## 2024-10-06 ENCOUNTER — Ambulatory Visit (INDEPENDENT_AMBULATORY_CARE_PROVIDER_SITE_OTHER): Admitting: Otolaryngology
